# Patient Record
Sex: Female | Born: 1965 | Race: Black or African American | Hispanic: No | Marital: Single
Health system: Southern US, Community
[De-identification: ages and names within clinical notes are randomized; demographics above are authoritative.]

---

## 1997-09-26 ENCOUNTER — Other Ambulatory Visit: Admission: RE | Admit: 1997-09-26 | Discharge: 1997-09-26 | Payer: Self-pay | Admitting: Obstetrics and Gynecology

## 1997-12-22 ENCOUNTER — Other Ambulatory Visit: Admission: RE | Admit: 1997-12-22 | Discharge: 1997-12-22 | Payer: Self-pay | Admitting: Obstetrics and Gynecology

## 1998-05-12 ENCOUNTER — Other Ambulatory Visit: Admission: RE | Admit: 1998-05-12 | Discharge: 1998-05-12 | Payer: Self-pay | Admitting: Obstetrics and Gynecology

## 1998-08-11 ENCOUNTER — Other Ambulatory Visit: Admission: RE | Admit: 1998-08-11 | Discharge: 1998-08-11 | Payer: Self-pay | Admitting: Obstetrics and Gynecology

## 1998-10-12 ENCOUNTER — Ambulatory Visit (HOSPITAL_COMMUNITY): Admission: RE | Admit: 1998-10-12 | Discharge: 1998-10-12 | Payer: Self-pay | Admitting: Internal Medicine

## 1999-08-01 ENCOUNTER — Other Ambulatory Visit: Admission: RE | Admit: 1999-08-01 | Discharge: 1999-08-01 | Payer: Self-pay | Admitting: Obstetrics and Gynecology

## 1999-10-03 ENCOUNTER — Encounter (INDEPENDENT_AMBULATORY_CARE_PROVIDER_SITE_OTHER): Payer: Self-pay | Admitting: Specialist

## 1999-10-03 ENCOUNTER — Ambulatory Visit (HOSPITAL_COMMUNITY): Admission: RE | Admit: 1999-10-03 | Discharge: 1999-10-03 | Payer: Self-pay | Admitting: Obstetrics and Gynecology

## 2002-03-26 ENCOUNTER — Encounter: Payer: Self-pay | Admitting: Internal Medicine

## 2002-03-26 ENCOUNTER — Encounter: Admission: RE | Admit: 2002-03-26 | Discharge: 2002-03-26 | Payer: Self-pay | Admitting: Internal Medicine

## 2003-04-27 ENCOUNTER — Other Ambulatory Visit: Admission: RE | Admit: 2003-04-27 | Discharge: 2003-04-27 | Payer: Self-pay | Admitting: Obstetrics and Gynecology

## 2004-03-07 ENCOUNTER — Other Ambulatory Visit: Admission: RE | Admit: 2004-03-07 | Discharge: 2004-03-07 | Payer: Self-pay | Admitting: Internal Medicine

## 2004-03-16 ENCOUNTER — Encounter: Admission: RE | Admit: 2004-03-16 | Discharge: 2004-03-16 | Payer: Self-pay | Admitting: Internal Medicine

## 2005-03-08 ENCOUNTER — Other Ambulatory Visit: Admission: RE | Admit: 2005-03-08 | Discharge: 2005-03-08 | Payer: Self-pay | Admitting: Internal Medicine

## 2006-03-24 ENCOUNTER — Encounter: Admission: RE | Admit: 2006-03-24 | Discharge: 2006-03-24 | Payer: Self-pay | Admitting: Internal Medicine

## 2007-04-14 ENCOUNTER — Other Ambulatory Visit: Admission: RE | Admit: 2007-04-14 | Discharge: 2007-04-14 | Payer: Self-pay | Admitting: Internal Medicine

## 2007-08-12 ENCOUNTER — Inpatient Hospital Stay (HOSPITAL_COMMUNITY): Admission: AD | Admit: 2007-08-12 | Discharge: 2007-08-13 | Payer: Self-pay | Admitting: Obstetrics & Gynecology

## 2007-12-02 ENCOUNTER — Encounter: Admission: RE | Admit: 2007-12-02 | Discharge: 2007-12-02 | Payer: Self-pay | Admitting: Internal Medicine

## 2009-04-28 ENCOUNTER — Other Ambulatory Visit
Admission: RE | Admit: 2009-04-28 | Discharge: 2009-04-28 | Payer: Self-pay | Source: Home / Self Care | Admitting: Internal Medicine

## 2009-12-08 ENCOUNTER — Ambulatory Visit (HOSPITAL_COMMUNITY)
Admission: RE | Admit: 2009-12-08 | Discharge: 2009-12-08 | Payer: Self-pay | Source: Home / Self Care | Admitting: Obstetrics & Gynecology

## 2010-01-16 ENCOUNTER — Ambulatory Visit (HOSPITAL_COMMUNITY): Admission: RE | Admit: 2010-01-16 | Payer: Self-pay | Source: Home / Self Care | Admitting: Obstetrics & Gynecology

## 2010-02-14 ENCOUNTER — Encounter: Payer: Self-pay | Admitting: Obstetrics & Gynecology

## 2010-06-01 NOTE — Op Note (Signed)
[facility]  Patient:    Osborne, Sarah                       MRN: [id number] Proc. Date: 10/03/99 Adm. Date:  [id number] Attending:  Maxie Better                           Operative Report  PREOPERATIVE DIAGNOSIS:       Exocervical polyp.  PROCEDURE:                    Exocervical polypectomy.  POSTOPERATIVE DIAGNOSIS:      Exocervical polyp.  ANESTHESIA:                   MAC, paracervical block.  SURGEON:                      Sheronette A. Cousins, M.D.  DESCRIPTION OF PROCEDURE:     Under adequate monitored anesthesia, the patient was placed in the dorsal lithotomy position.  She was sterilely prepped and draped in the usual fashion.  The bladder was catheterized to a large amount of urine.  A bivalve speculum was placed in the vagina.  No vaginal lesions were noted.  The cervix was noted to have a protuberant mass arising from the posterior lip of the cervix, and extending into the endocervical canal.  Then 20 cc total of 1% Nesacaine was injected paracervically and on the posterior lip of the cervix.  The mass was grasped with a small ring clamp.  The base was excised using a #15 scalpel, and the polyp was removed by using Metzenbaum scissors to remove the base.  Inspection of the endocervical canal showed still portions of the stalk present, which were then easily again removed using Metzenbaum scissors.  The posterior lip was slightly bleeding.  The base was cauterized.  Monsel solution was applied.  The procedure was then terminated by removing all instruments after documented hemostasis was noted.  SPECIMENS:                    Exocervical polyp.  ESTIMATED BLOOD LOSS:         Minimal.  COMPLICATIONS:                None.  The patient tolerated the procedure well and was transferred to the recovery room in stable condition. DD:  10/03/99 TD:  10/04/99 Job: [id number]

## 2010-10-12 LAB — CBC
HCT: 37.3
Hemoglobin: 12.3
MCHC: 33
MCV: 89.1
RBC: 4.19
RDW: 14.2

## 2010-10-12 LAB — WET PREP, GENITAL: Yeast Wet Prep HPF POC: NONE SEEN

## 2010-10-12 LAB — POCT PREGNANCY, URINE: Preg Test, Ur: POSITIVE

## 2010-10-12 LAB — URINALYSIS, ROUTINE W REFLEX MICROSCOPIC
Glucose, UA: NEGATIVE
Leukocytes, UA: NEGATIVE
Nitrite: NEGATIVE
Specific Gravity, Urine: 1.03
pH: 5

## 2010-10-12 LAB — GC/CHLAMYDIA PROBE AMP, GENITAL
Chlamydia, DNA Probe: NEGATIVE
GC Probe Amp, Genital: NEGATIVE

## 2012-11-10 IMAGING — US US OB COMP LESS 14 WK
1 series · 13 of 28 positions shown · non-contrast
Comparison: none

[Series 1: us ob comp less 14 wks · 0.17mm/px · 13 of 29 slices shown]
[im 2/29]
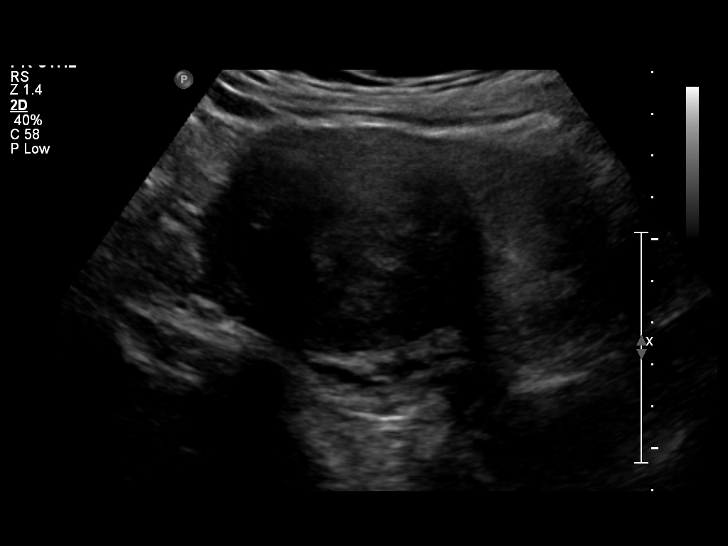
[im 4/29]
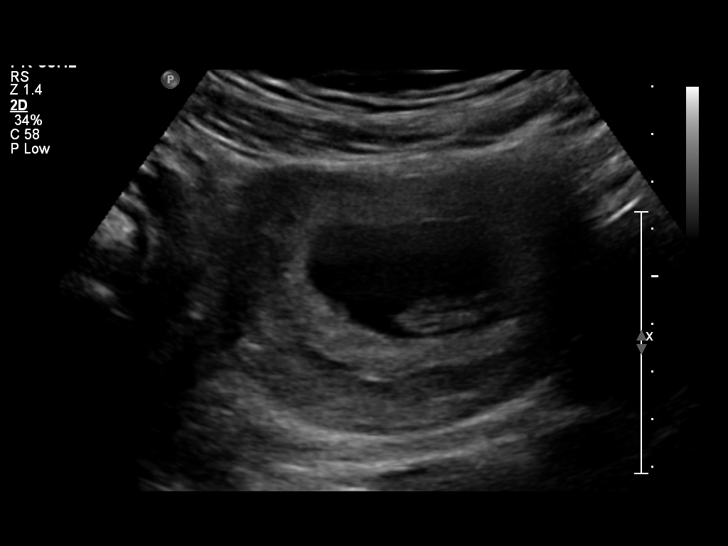
[im 6/29]
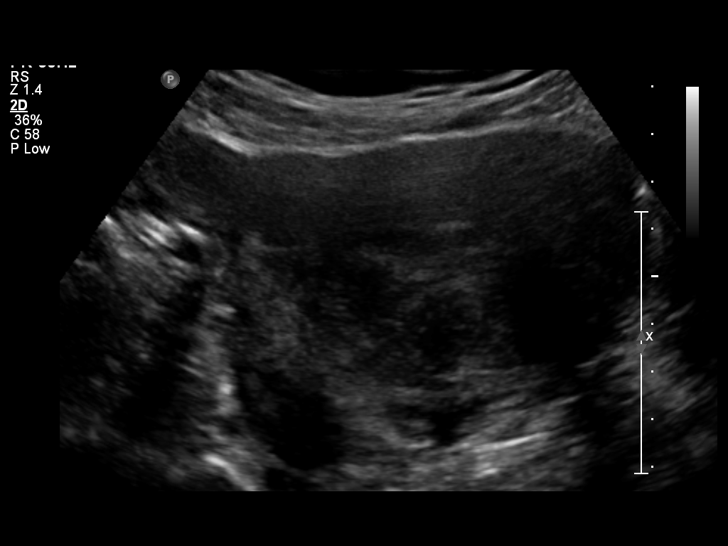
[im 8/29]
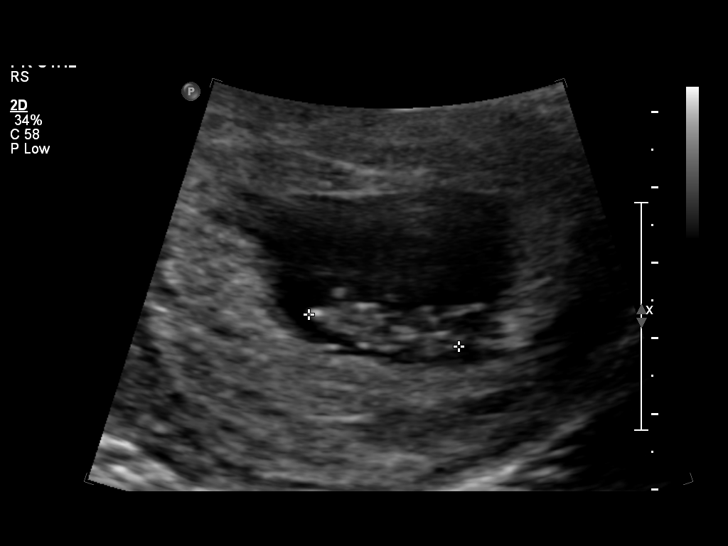
[im 10/29]
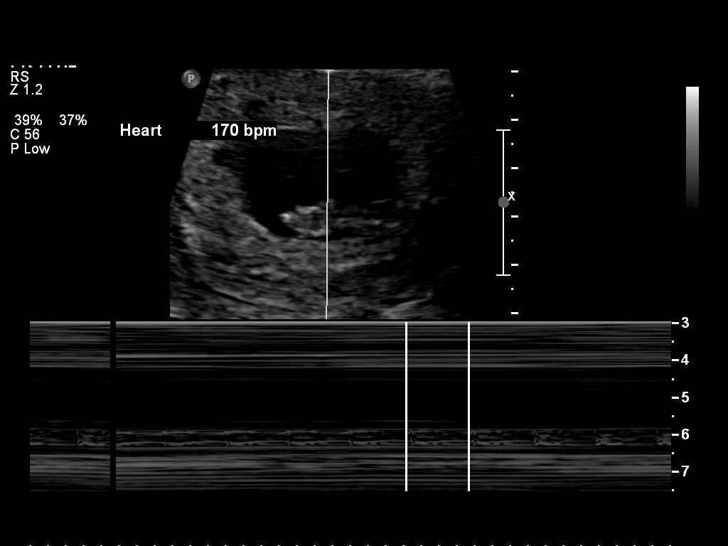
[im 12/29]
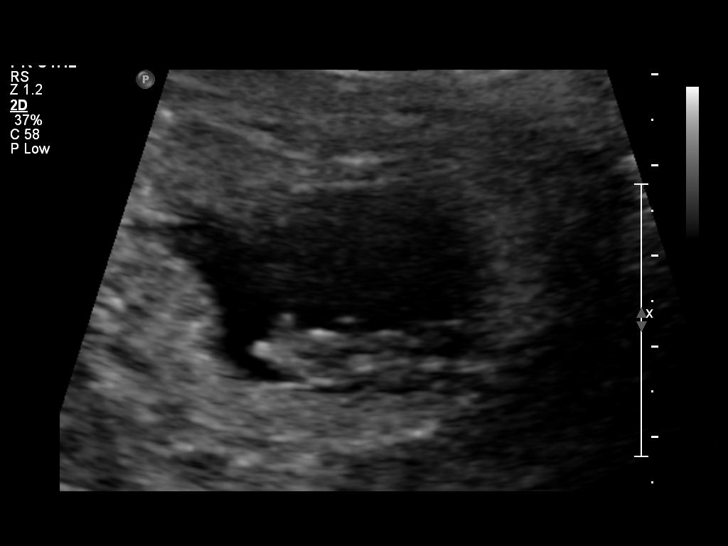
[im 15/29]
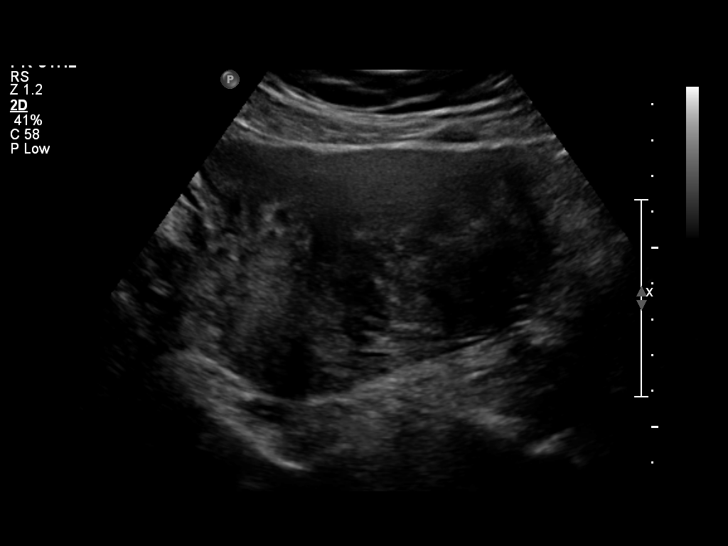
[im 17/29]
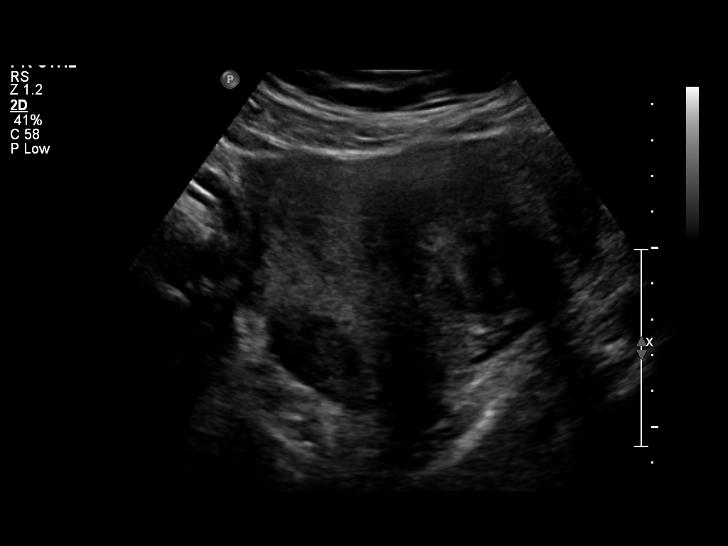
[im 19/29]
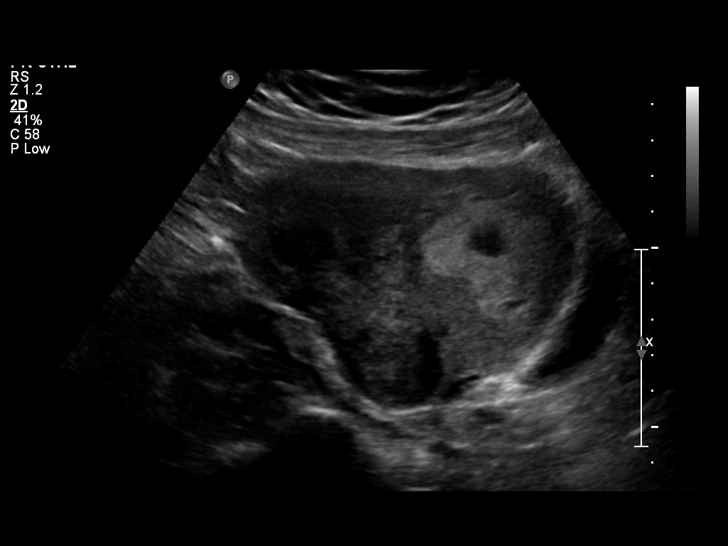
[im 21/29]
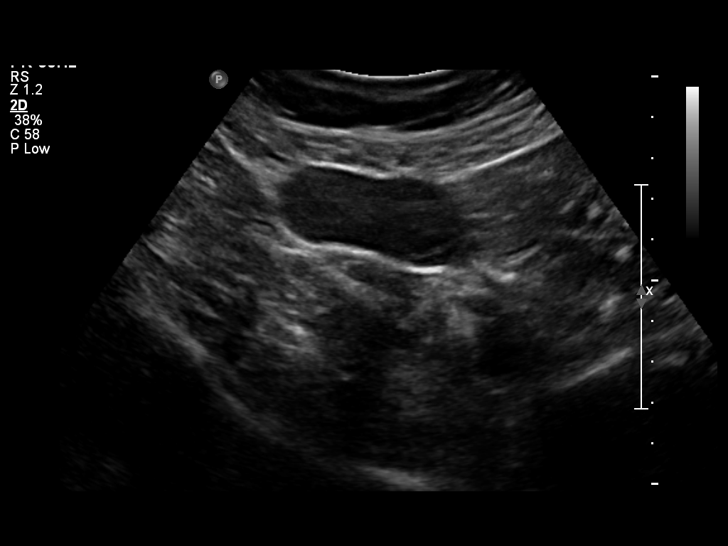
[im 23/29]
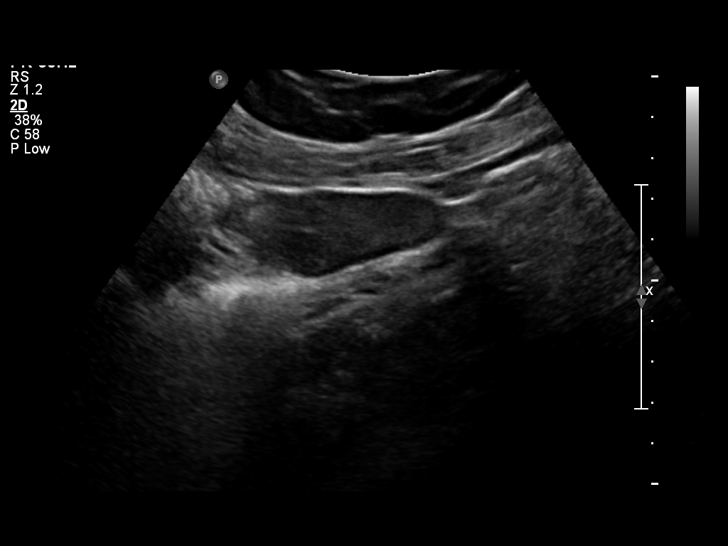
[im 25/29]
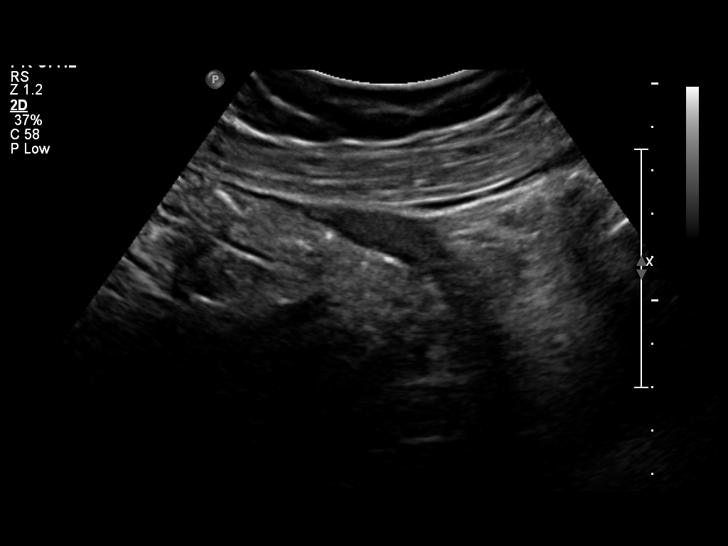
[im 27/29]
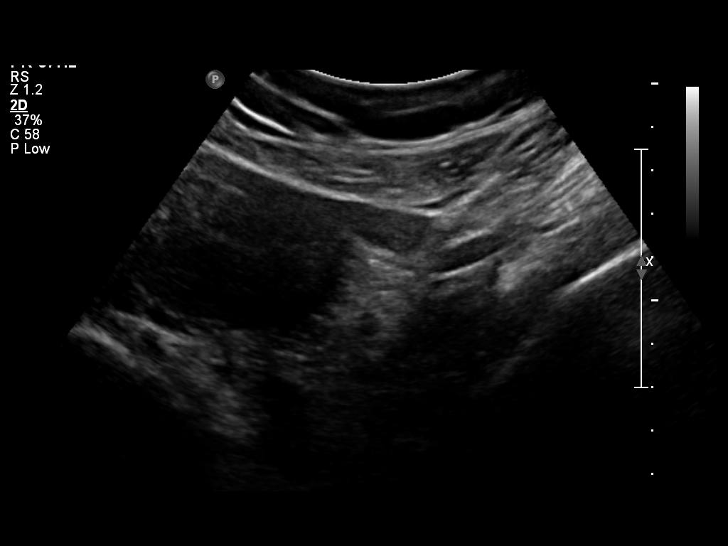

[13 of 28 positions shown; findings below may reference images not displayed]

OBSTETRICS REPORT
                      (Signed Final 12/08/2009 [DATE])

 Order#:         _O
Procedures

 US OB COMP LESS 14 WKS                                76801.0
Indications

 Viability
 IVF
Fetal Evaluation

 Fetal Heart Rate:  170                          bpm
 Cardiac Activity:  Observed

 Amniotic Fluid
 AFI FV:      Subjectively within normal limits
Biometry

 CRL:     20.9  mm     G. Age:  8w 4d                  EDD:    07/16/10
Gestational Age

 Best:          8w 4d      Det. By:  D.O. Conception          EDD:   07/16/10
                                     (10/23/09)
Cervix Uterus Adnexa

 Cervix:       Normal appearance by transabdominal scan.
 Left Ovary:    Within normal limits measuirng 2.5 x 1.0 x 1.8 cm.
 Right Ovary:   Within normal limits measuring 4.6 x 1.9 x 4.0 cm.

 Adnexa:     No abnormality visualized.
Myomas

 Site                     L(cm)      W(cm)       D(cm)      Location
 Fundus Left              6.8        6.6         4.8        Intramural
 Posterior Mid                       2.6         2.2        Intramural
 Blood Flow                  RI       PI       Comments
Impression

 There is a single living intrauterine pregancy demonstrating
 an EGA by CRL of  8w 4d. This correlates well with expected
 EGA by date of conception of 8w 4d.

 Normal ovaries.

 Focal fibroids with sizes and locations as noted above.

## 2012-11-30 ENCOUNTER — Ambulatory Visit (INDEPENDENT_AMBULATORY_CARE_PROVIDER_SITE_OTHER): Payer: Medicaid Other | Admitting: Obstetrics & Gynecology

## 2012-11-30 ENCOUNTER — Encounter: Payer: Self-pay | Admitting: Obstetrics & Gynecology

## 2012-11-30 VITALS — BP 128/82 | Temp 98.1°F | Wt 221.0 lb

## 2012-11-30 VITALS — Ht 63.0 in | Wt 221.0 lb

## 2012-11-30 DIAGNOSIS — Z3481 Encounter for supervision of other normal pregnancy, first trimester: Secondary | ICD-10-CM

## 2012-11-30 DIAGNOSIS — O09521 Supervision of elderly multigravida, first trimester: Secondary | ICD-10-CM

## 2012-11-30 DIAGNOSIS — O09519 Supervision of elderly primigravida, unspecified trimester: Secondary | ICD-10-CM

## 2012-11-30 DIAGNOSIS — Z3201 Encounter for pregnancy test, result positive: Secondary | ICD-10-CM

## 2012-11-30 LAB — POCT URINALYSIS DIPSTICK
Bilirubin, UA: NEGATIVE
Blood, UA: NEGATIVE
Ketones, UA: NEGATIVE
Spec Grav, UA: <=1.005
pH, UA: 8

## 2012-11-30 MED ORDER — PROGESTERONE MICRONIZED 200 MG PO CAPS: 200.0000 mg | ORAL_CAPSULE | Freq: Every day | ORAL | Status: AC

## 2012-11-30 NOTE — Progress Notes (Signed)
Subjective:    Sarah Osborne is being seen today for her first obstetrical visit.  This is not a planned pregnancy. She is at [redacted]w[redacted]d gestation. Her obstetrical history is significant for advanced maternal age. Relationship with FOB: significant other, not living together. Patient does intend to breast feed. Pregnancy history fully reviewed.  Menstrual History: OB History   Grav Para Term Preterm Abortions TAB SAB Ect Mult Living   1 1 1       1        Patient's last menstrual period was 10/23/2012.    The following portions of the patient's history were reviewed and updated as appropriate: allergies, current medications, past family history, past medical history, past social history, past surgical history and problem list.  Review of Systems Pertinent items are noted in HPI.    Objective:      General Appearance:    Alert, cooperative, no distress, appears stated age  Head:    Normocephalic, without obvious abnormality, atraumatic  Eyes:    PERRL, conjunctiva/corneas clear, EOM's intact, fundi    benign, both eyes  Ears:    Normal TM's and external ear canals, both ears  Nose:   Nares normal, septum midline, mucosa normal, no drainage    or sinus tenderness  Throat:   Lips, mucosa, and tongue normal; teeth and gums normal  Neck:   Supple, symmetrical, trachea midline, no adenopathy;    thyroid:  no enlargement/tenderness/nodules; no carotid   bruit or JVD  Back:     Symmetric, no curvature, ROM normal, no CVA tenderness  Lungs:     Clear to auscultation bilaterally, respirations unlabored  Chest Wall:    No tenderness or deformity   Heart:    Regular rate and rhythm, S1 and S2 normal, no murmur, rub   or gallop  Breast Exam:    No tenderness, masses, or nipple abnormality  Abdomen:     Soft, non-tender, bowel sounds active all four quadrants,    no masses, no organomegaly  Genitalia:    Normal female without lesion, discharge or tenderness  Extremities:   Extremities normal,  atraumatic, no cyanosis or edema  Pulses:   2+ and symmetric all extremities  Skin:   Skin color, texture, turgor normal, no rashes or lesions  Lymph nodes:   Cervical, supraclavicular, and axillary nodes normal  Neurologic:   CNII-XII intact, normal strength, sensation and reflexes    throughout   Informal U/S by me: [redacted]w[redacted]d IUGS Assessment:    Likely pregnancy at [redacted]w[redacted]d weeks--two outside UPT positive; UPT today negative     Plan:    Orders Placed This Encounter  Procedures  . hCG, quantitative, pregnancy  . Progesterone  . POCT urinalysis dipstick  . POCT urine pregnancy   Prenatal vitamins.  Counseling provided regarding continued use of seat belts, cessation of alcohol consumption, smoking or use of illicit drugs; infection precautions i.e., influenza/TDAP immunizations, toxoplasmosis,CMV, parvovirus, listeria and varicella; workplace safety, exercise during pregnancy; routine dental care, safe medications, sexual activity, hot tubs, saunas, pools, travel, caffeine use, fish and methlymercury, potential toxins, hair treatments, varicose veins Weight gain recommendations reviewed:  overweight/BMI 25 - 29.9--> gain 15 - 25 lbs Problem list reviewed and updated. Follow up in 1 weeks. 50% of 20 min visit spent on counseling and coordination of care.

## 2012-12-01 DIAGNOSIS — O09529 Supervision of elderly multigravida, unspecified trimester: Secondary | ICD-10-CM | POA: Insufficient documentation

## 2012-12-01 LAB — PROGESTERONE: Progesterone: 5.2 ng/mL

## 2012-12-01 NOTE — Patient Instructions (Signed)
Prenatal Care  WHAT IS PRENATAL CARE?  Prenatal care means health care during your pregnancy, before your baby is born. Take care of yourself and your baby by:   Getting early prenatal care. If you know you are pregnant, or think you might be pregnant, call your caregiver as soon as possible. Schedule a visit for a general/prenatal examination.  Getting regular prenatal care. Follow your caregiver's schedule for blood and other necessary tests. Do not miss appointments.  Do everything you can to keep yourself and your baby healthy during your pregnancy.  Prenatal care should include evaluation of medical, dietary, educational, psychological, and social needs for the couple and the medical, surgical, and genetic history of the family of the mother and father.  Discuss with your caregiver:  Your medicines, prescription, over-the-counter, and herbal medicines.  Substance abuse, alcohol, smoking, and illegal drugs.  Domestic abuse and violence, if present.  Your immunizations.  Nutrition and diet.  Exercising.  Environment and occupational hazards, at home and at work.  History of sexually transmitted disease, both you and your partner.  Previous pregnancies. WHY IS PRENATAL CARE SO IMPORTANT?  By seeing you regularly, your caregiver has the chance to find problems early, so that they can be treated as soon as possible. Other problems might be prevented. Many studies have shown that early and regular prenatal care is important for the health of both mothers and their babies.  I AM THINKING ABOUT GETTING PREGNANT. HOW CAN I TAKE CARE OF MYSELF?  Taking care of yourself before you get pregnant helps you to have a healthy pregnancy. It also lowers your chances of having a baby born with a birth defect. Here are ways to take care of yourself before you get pregnant:   Eat healthy foods, exercise regularly (30 minutes per day for most days of the week is best), and get enough rest and  sleep. Talk to your caregiver about what kinds of foods and exercises are best for you.  Take 400 micrograms (mcg) of folic acid (one of the B vitamins) every day. The best way to do this is to take a daily multivitamin pill that contains this amount of folic acid. Getting enough of the synthetic (manufactured) form of folic acid every day before you get pregnant and during early pregnancy can help prevent certain birth defects. Many breakfast cereals and other grain products have folic acid added to them, but only certain cereals contain 400 mcg of folic acid per serving. Check the label on your multivitamin or cereal to find the amount of folic acid in the food.  See your caregiver for a complete check up before getting pregnant. Make sure that you have had all your immunization shots, especially for rubella (German measles). Rubella can cause serious birth defects. Chickenpox is another illness you want to avoid during pregnancy. If you have had chickenpox and rubella in the past, you should be immune to them.  Tell your caregiver about any prescription or non-prescription medicines (including herbal remedies) you are taking. Some medicines are not safe to take during pregnancy.  Stop smoking cigarettes, drinking alcohol, or taking illegal drugs. Ask your caregiver for help, if you need it. You can also get help with alcohol and drugs by talking with a member of your faith community, a counselor, or a trusted friend.  Discuss and treat any medical, social, or psychological problems before getting pregnant.  Discuss any history of genetic problems in the mother, father, and their families. Do   genetic testing before getting pregnant, when possible.  Discuss any physical or emotional abuse with your caregiver.  Discuss with your caregiver if you might be exposed to harmful chemicals on your job or where you live.  Discuss with your caregiver if you think your job or the hours you work may be  harmful and should be changed.  The father should be involved with the decision making and with all aspects of the pregnancy, labor, and delivery.  If you have medical insurance, make sure you are covered for pregnancy. I JUST FOUND OUT THAT I AM PREGNANT. HOW CAN I TAKE CARE OF MYSELF?  Here are ways to take care of yourself and the precious new life growing inside you:   Continue taking your multivitamin with 400 micrograms (mcg) of folic acid every day.  Get early and regular prenatal care. It does not matter if this is your first pregnancy or if you already have children. It is very important to see a caregiver during your pregnancy. Your caregiver will check at each visit to make sure that you and the baby are healthy. If there are any problems, action can be taken right away to help you and the baby.  Eat a healthy diet that includes:  Fruits.  Vegetables.  Foods low in saturated fat.  Grains.  Calcium-rich foods.  Drink 6 to 8 glasses of liquids a day.  Unless your caregiver tells you not to, try to be physically active for 30 minutes, most days of the week. If you are pressed for time, you can get your activity in through 10 minute segments, three times a day.  If you smoke, drink alcohol, or use drugs, STOP. These can cause long-term damage to your baby. Talk with your caregiver about steps to take to stop smoking. Talk with a member of your faith community, a counselor, a trusted friend, or your caregiver if you are concerned about your alcohol or drug use.  Ask your caregiver before taking any medicine, even over-the-counter medicines. Some medicines are not safe to take during pregnancy.  Get plenty of rest and sleep.  Avoid hot tubs and saunas during pregnancy.  Do not have X-rays taken, unless absolutely necessary and with the recommendation of your caregiver. A lead shield can be placed on your abdomen, to protect the baby when X-rays are taken in other parts of the  body.  Do not empty the cat litter when you are pregnant. It may contain a parasite that causes an infection called toxoplasmosis, which can cause birth defects. Also, use gloves when working in garden areas used by cats.  Do not eat uncooked or undercooked cheese, meats, or fish.  Stay away from toxic chemicals like:  Insecticides.  Solvents (some cleaners or paint thinners).  Lead.  Mercury.  Sexual relations may continue until the end of the pregnancy, unless you have a medical problem or there is a problem with the pregnancy and your caregiver tells you not to.  Do not wear high heel shoes, especially during the second half of the pregnancy. You can lose your balance and fall.  Do not take long trips, unless absolutely necessary. Be sure to see your caregiver before going on the trip.  Do not sit in one position for more than 2 hours, when on a trip.  Take a copy of your medical records when going on a trip.  Know where there is a hospital in the city you are visiting, in case of an   emergency.  Most dangerous household products will have pregnancy warnings on their labels. Ask your caregiver about products if you are unsure.  Limit or eliminate your caffeine intake from coffee, tea, sodas, medicines, and chocolate.  Many women continue working through pregnancy. Staying active might help you stay healthier. If you have a question about the safety or the hours you work at your particular job, talk with your caregiver.  Get informed:  Read books.  Watch videos.  Go to childbirth classes for you and the father.  Talk with experienced moms.  Ask your caregiver about childbirth education classes for you and your partner. Classes can help you and your partner prepare for the birth of your baby.  Ask about a pediatrician (baby doctor) and methods and pain medicine for labor, delivery, and possible Cesarean delivery (C-section). I AM NOT THINKING ABOUT GETTING PREGNANT  RIGHT NOW, BUT HEARD THAT ALL WOMEN SHOULD TAKE FOLIC ACID EVERY DAY?  All women of childbearing age, with even a remote chance of getting pregnant, should try to make sure they get enough folic acid. Many pregnancies are not planned. Many women do not know they are actually pregnant early in their pregnancies, and certain birth defects happen in the very early part of pregnancy. Taking 400 micrograms (mcg) of folic acid every day will help prevent certain birth defects that happen in the early part of pregnancy. If a woman begins taking vitamin pills in the second or third month of pregnancy, it may be too late to prevent birth defects. Folic acid may also have other health benefits for women, besides preventing birth defects.  HOW OFTEN SHOULD I SEE MY CAREGIVER DURING PREGNANCY?  Your caregiver will give you a schedule for your prenatal visits. You will have visits more often as you get closer to the end of your pregnancy. An average pregnancy lasts about 40 weeks.  A typical schedule includes visiting your caregiver:   About once each month, during your first 6 months of pregnancy.  Every 2 weeks, during the next 2 months.  Weekly in the last month, until the delivery date. Your caregiver will probably want to see you more often if:  You are over 35.  Your pregnancy is high risk, because you have certain health problems or problems with the pregnancy, such as:  Diabetes.  High blood pressure.  The baby is not growing on schedule, according to the dates of the pregnancy. Your caregiver will do special tests, to make sure you and the baby are not having any serious problems. WHAT HAPPENS DURING PRENATAL VISITS?   At your first prenatal visit, your caregiver will talk to you about you and your partner's health history and your family's health history, and will do a physical exam.  On your first visit, a physical exam will include checks of your blood pressure, height and weight, and an  exam of your pelvic organs. Your caregiver will do a Pap test if you have not had one recently, and will do cultures of your cervix to make sure there is no infection.  At each visit, there will be tests of your blood, urine, blood pressure, weight, and checking the progress of the baby.  Your caregiver will be able to tell you when to expect that your baby will be born.  Each visit is also a chance for you to learn about staying healthy during pregnancy and for asking questions.  Discuss whether you will be breastfeeding.  At your later prenatal   visits, your caregiver will check how you are doing and how the baby is developing. You may have a number of tests done as your pregnancy progresses.  Ultrasound exams are often used to check on the baby's growth and health.  You may have more urine and blood tests, as well as special tests, if needed. These may include amniocentesis (examine fluid in the pregnancy sac), stress tests (check how baby responds to contractions), biophysical profile (measures fetus well-being). Your caregiver will explain the tests and why they are necessary. I AM IN MY LATE THIRTIES, AND I WANT TO HAVE A CHILD NOW. SHOULD I DO ANYTHING SPECIAL?  As you get older, there is more chance of having a medical problem (high blood pressure), pregnancy problem (preeclampsia, problems with the placenta), miscarriage, or a baby born with a birth defect. However, most women in their late thirties and early forties have healthy babies. See your caregiver on a regular basis before you get pregnant and be sure to go for exams throughout your pregnancy. Your caregiver probably will want to do some special tests to check on you and your baby's health when you are pregnant.  Women today are often delaying having children until later in life, when they are in their thirties and forties. While many women in their thirties and forties have no difficulty getting pregnant, fertility does decline  with age. For women over 40 who cannot get pregnant after 6 months of trying, it is recommended that they see their caregiver for a fertility evaluation. It is not uncommon to have trouble becoming pregnant or experience infertility (inability to become pregnant after trying for one year). If you think that you or your partner may be infertile, you can discuss this with your caregiver. He or she can recommend treatments such as drugs, surgery, or assisted reproductive technology.  Document Released: 01/03/2003 Document Revised: 03/25/2011 Document Reviewed: 06/18/2012 [employer] Patient Information 2014 [employer].  

## 2012-12-02 ENCOUNTER — Other Ambulatory Visit: Payer: Self-pay | Admitting: *Deleted

## 2012-12-02 DIAGNOSIS — O0990 Supervision of high risk pregnancy, unspecified, unspecified trimester: Secondary | ICD-10-CM

## 2012-12-07 ENCOUNTER — Ambulatory Visit (INDEPENDENT_AMBULATORY_CARE_PROVIDER_SITE_OTHER): Payer: Medicaid Other | Admitting: Obstetrics & Gynecology

## 2012-12-07 ENCOUNTER — Ambulatory Visit (HOSPITAL_COMMUNITY): Payer: Medicaid Other

## 2012-12-07 ENCOUNTER — Encounter: Payer: Self-pay | Admitting: Obstetrics & Gynecology

## 2012-12-07 VITALS — BP 137/83 | Temp 98.7°F | Wt 219.0 lb

## 2012-12-07 DIAGNOSIS — Z3481 Encounter for supervision of other normal pregnancy, first trimester: Secondary | ICD-10-CM

## 2012-12-07 DIAGNOSIS — D259 Leiomyoma of uterus, unspecified: Secondary | ICD-10-CM | POA: Insufficient documentation

## 2012-12-07 DIAGNOSIS — Z3480 Encounter for supervision of other normal pregnancy, unspecified trimester: Secondary | ICD-10-CM

## 2012-12-07 DIAGNOSIS — O200 Threatened abortion: Secondary | ICD-10-CM

## 2012-12-07 LAB — POCT URINALYSIS DIPSTICK
Bilirubin, UA: NEGATIVE
Leukocytes, UA: NEGATIVE
Nitrite, UA: NEGATIVE
pH, UA: 5

## 2012-12-07 NOTE — Progress Notes (Signed)
Pulse 101  Pt states that she has had some lower abdominal pressure.  U/S by me--irregular GS 17 mm.  No YS/FP.

## 2012-12-08 LAB — PROGESTERONE: Progesterone: 9.29 ng/mL

## 2012-12-08 LAB — HCG, QUANTITATIVE, PREGNANCY: hCG, Beta Chain, Quant, S: 907.5 m[IU]/mL

## 2012-12-14 ENCOUNTER — Ambulatory Visit (INDEPENDENT_AMBULATORY_CARE_PROVIDER_SITE_OTHER): Payer: Medicaid Other | Admitting: Obstetrics & Gynecology

## 2012-12-14 ENCOUNTER — Encounter: Payer: Medicaid Other | Admitting: Obstetrics & Gynecology

## 2012-12-14 ENCOUNTER — Ambulatory Visit (HOSPITAL_COMMUNITY)
Admission: RE | Admit: 2012-12-14 | Discharge: 2012-12-14 | Disposition: A | Discharge status: Home / Self Care | Payer: Medicaid Other | Source: Ambulatory Visit | Attending: Obstetrics & Gynecology | Admitting: Obstetrics & Gynecology

## 2012-12-14 ENCOUNTER — Other Ambulatory Visit: Payer: Self-pay | Admitting: Obstetrics & Gynecology

## 2012-12-14 VITALS — BP 135/95 | Temp 99.0°F | Wt 220.0 lb

## 2012-12-14 DIAGNOSIS — O0990 Supervision of high risk pregnancy, unspecified, unspecified trimester: Secondary | ICD-10-CM | POA: Insufficient documentation

## 2012-12-14 DIAGNOSIS — Z3481 Encounter for supervision of other normal pregnancy, first trimester: Secondary | ICD-10-CM

## 2012-12-14 DIAGNOSIS — O209 Hemorrhage in early pregnancy, unspecified: Secondary | ICD-10-CM | POA: Insufficient documentation

## 2012-12-14 DIAGNOSIS — O200 Threatened abortion: Secondary | ICD-10-CM

## 2012-12-14 DIAGNOSIS — Z3480 Encounter for supervision of other normal pregnancy, unspecified trimester: Secondary | ICD-10-CM

## 2012-12-14 NOTE — Progress Notes (Signed)
IUGS present--informal U/S.  Early pregnancy failure--likely IUP.  Formal U/S today.  Repeat quantitative bHCG.

## 2012-12-14 NOTE — Progress Notes (Signed)
Pt not evaluated

## 2012-12-14 NOTE — Patient Instructions (Signed)
Miscarriage A miscarriage is the sudden loss of an unborn baby (fetus) before the 20th week of pregnancy. Most miscarriages happen in the first 3 months of pregnancy. Sometimes, it happens before a woman even knows she is pregnant. A miscarriage is also called a "spontaneous miscarriage" or "early pregnancy loss." Having a miscarriage can be an emotional experience. Talk with your caregiver about any questions you may have about miscarrying, the grieving process, and your future pregnancy plans. CAUSES   Problems with the fetal chromosomes that make it impossible for the baby to develop normally. Problems with the baby's genes or chromosomes are most often the result of errors that occur, by chance, as the embryo divides and grows. The problems are not inherited from the parents.  Infection of the cervix or uterus.   Hormone problems.   Problems with the cervix, such as having an incompetent cervix. This is when the tissue in the cervix is not strong enough to hold the pregnancy.   Problems with the uterus, such as an abnormally shaped uterus, uterine fibroids, or congenital abnormalities.   Certain medical conditions.   Smoking, drinking alcohol, or taking illegal drugs.   Trauma.  Often, the cause of a miscarriage is unknown.  SYMPTOMS   Vaginal bleeding or spotting, with or without cramps or pain.  Pain or cramping in the abdomen or lower back.  Passing fluid, tissue, or blood clots from the vagina. DIAGNOSIS  Your caregiver will perform a physical exam. You may also have an ultrasound to confirm the miscarriage. Blood or urine tests may also be ordered. TREATMENT   Sometimes, treatment is not necessary if you naturally pass all the fetal tissue that was in the uterus. If some of the fetus or placenta remains in the body (incomplete miscarriage), tissue left behind may become infected and must be removed. Usually, a dilation and curettage (D and C) procedure is performed.  During a D and C procedure, the cervix is widened (dilated) and any remaining fetal or placental tissue is gently removed from the uterus.  Antibiotic medicines are prescribed if there is an infection. Other medicines may be given to reduce the size of the uterus (contract) if there is a lot of bleeding.  If you have Rh negative blood and your baby was Rh positive, you will need a Rh immunoglobulin shot. This shot will protect any future baby from having Rh blood problems in future pregnancies. HOME CARE INSTRUCTIONS   Your caregiver may order bed rest or may allow you to continue light activity. Resume activity as directed by your caregiver.  Have someone help with home and family responsibilities during this time.   Keep track of the number of sanitary pads you use each day and how soaked (saturated) they are. Write down this information.   Do not use tampons. Do not douche or have sexual intercourse until approved by your caregiver.   Only take over-the-counter or prescription medicines for pain or discomfort as directed by your caregiver.   Do not take aspirin. Aspirin can cause bleeding.   Keep all follow-up appointments with your caregiver.   If you or your partner have problems with grieving, talk to your caregiver or seek counseling to help cope with the pregnancy loss. Allow enough time to grieve before trying to get pregnant again.  SEEK IMMEDIATE MEDICAL CARE IF:   You have severe cramps or pain in your back or abdomen.  You have a fever.  You pass large blood clots (walnut-sized   or larger) ortissue from your vagina. Save any tissue for your caregiver to inspect.   Your bleeding increases.   You have a thick, bad-smelling vaginal discharge.  You become lightheaded, weak, or you faint.   You have chills.  MAKE SURE YOU:  Understand these instructions.  Will watch your condition.  Will get help right away if you are not doing well or get  worse. Document Released: 06/26/2000 Document Revised: 04/27/2012 Document Reviewed: 02/19/2011 [employer] Patient Information 2014 [employer].  

## 2012-12-14 NOTE — Progress Notes (Signed)
HR Pt in office for routine OB visit, reports small amount bright red vaginal discharge, states started yesterday, reports mild abd cramping.

## 2012-12-15 LAB — HCG, QUANTITATIVE, PREGNANCY: hCG, Beta Chain, Quant, S: 544.2 m[IU]/mL

## 2012-12-21 ENCOUNTER — Encounter: Payer: Medicaid Other | Admitting: Obstetrics & Gynecology

## 2012-12-21 ENCOUNTER — Ambulatory Visit (INDEPENDENT_AMBULATORY_CARE_PROVIDER_SITE_OTHER): Payer: Medicaid Other | Admitting: Obstetrics & Gynecology

## 2012-12-21 VITALS — BP 143/77 | Temp 97.8°F | Wt 221.0 lb

## 2012-12-21 DIAGNOSIS — N96 Recurrent pregnancy loss: Secondary | ICD-10-CM | POA: Insufficient documentation

## 2012-12-21 DIAGNOSIS — O039 Complete or unspecified spontaneous abortion without complication: Secondary | ICD-10-CM

## 2012-12-21 NOTE — Progress Notes (Signed)
Subjective:     Sarah Osborne is a 47 y.o. female here for f/u.  She reports she passed the pregnancy several days ago.  The bleeding has subsided.   Obstetric History OB History  Gravida Para Term Preterm AB SAB TAB Ectopic Multiple Living  1 1 1       1     # Outcome Date GA Lbr Len/2nd Weight Sex Delivery Anes PTL Lv  1 TRM 06/22/10 [redacted]w[redacted]d  2.211 kg (4 lb 14 oz) F CS EPI N Y       The following portions of the patient's history were reviewed and updated as appropriate: allergies, current medications, past family history, past medical history, past social history, past surgical history and problem list.  Review of Systems Pertinent items are noted in HPI.    Objective:     SPEC:  The cervix is closed Informal U/S: no IUGS     Assessment:   Complete AB  Plan:    Return in a few months

## 2012-12-21 NOTE — Progress Notes (Signed)
Pulse 89 Pt is in office today to follow up from possible miscarriage. Pt had an u/s on 12/14/12. Pt is here today to discuss results.

## 2012-12-21 NOTE — Patient Instructions (Signed)
Recurrent Miscarriage  Recurrent miscarriage means that a woman has lost two or more pregnancies in a row. The loss happens before 20 weeks of pregnancy. Primary recurrent miscarriage is with a woman that has never been able to give birth to a child. Secondary recurrent miscarriage is a woman who had a child and then had two or more miscarriages in a row.   CAUSES   · Your parents passed it to you (genetic).  · Chromosomal defects.  · Endocrine disorders. A person's endocrine system includes glands that make hormones.  · Having a lack of progesterone hormone in early pregnancy.  · Thyroid problems.  · Insulin resistance seen in diabetic women and women with Polycystic Ovary Syndrome that is hard to control.  · Abnormalities of the uterus.  · Certain viral and germ (bacterial) infections.  · Autoimmune disorders. This is when the immune system attacks or destroys healthy body tissue.  · Smoking, drinking or taking drugs or medicines.  · Being exposed to certain chemicals or toxins at home or work.  HOME CARE INSTRUCTIONS   · See your caregiver if you have two or more miscarriages.  · Follow the advice for testing and treatment that your caregiver recommends.  · Discuss any concerns or questions with your caregiver.  · Do not smoke or drink alcohol when pregnant.  · Recurrent miscarriages can have a severe emotional and psychological effect on a couple wanting to have children. They may have feelings of anger, blame, guilt and become depressed after losing a pregnancy many times. Join a support group or see a grief counselor if you have emotional problems because of pregnancy loss.  SEEK MEDICAL CARE IF:   · You are or think you are pregnant and have any kind of vaginal spotting or bleeding.  · You develop low abdominal cramps.  · You develop a fever of 102° F (38.9° C) or higher.  · You develop abnormal vaginal discharge.  · You have been or think you have been exposed to a spreadable (contagious) illness, toxins or  chemicals that make you sick to your stomach (nauseated) or throw up (vomit).  · You think you have been exposed to a sexually transmitted disease.  Document Released: 06/19/2007 Document Revised: 03/25/2011 Document Reviewed: 06/19/2007  [employer]® Patient Information ©2014 [employer].

## 2013-02-03 ENCOUNTER — Ambulatory Visit (INDEPENDENT_AMBULATORY_CARE_PROVIDER_SITE_OTHER): Payer: Medicaid Other | Admitting: Obstetrics & Gynecology

## 2013-02-03 ENCOUNTER — Other Ambulatory Visit: Payer: Medicaid Other

## 2013-02-03 ENCOUNTER — Other Ambulatory Visit: Payer: Self-pay | Admitting: Obstetrics & Gynecology

## 2013-02-03 DIAGNOSIS — Z124 Encounter for screening for malignant neoplasm of cervix: Secondary | ICD-10-CM

## 2013-02-03 DIAGNOSIS — Z0000 Encounter for general adult medical examination without abnormal findings: Secondary | ICD-10-CM

## 2013-02-03 DIAGNOSIS — IMO0002 Reserved for concepts with insufficient information to code with codable children: Secondary | ICD-10-CM

## 2013-02-03 DIAGNOSIS — Z1231 Encounter for screening mammogram for malignant neoplasm of breast: Secondary | ICD-10-CM

## 2013-02-03 LAB — CBC
HCT: 38.6 % (ref 36.0–46.0)
HEMOGLOBIN: 13 g/dL (ref 12.0–15.0)
MCH: 27.8 pg (ref 26.0–34.0)
MCHC: 33.7 g/dL (ref 30.0–36.0)
MCV: 82.5 fL (ref 78.0–100.0)
PLATELETS: 299 10*3/uL (ref 150–400)
RBC: 4.68 MIL/uL (ref 3.87–5.11)
RDW: 15 % (ref 11.5–15.5)
WBC: 7 10*3/uL (ref 4.0–10.5)

## 2013-02-03 MED ORDER — MEASLES, MUMPS & RUBELLA VAC ~~LOC~~ INJ: 0.5000 mL | INJECTION | Freq: Once | SUBCUTANEOUS | Status: DC

## 2013-02-03 NOTE — Progress Notes (Signed)
Patient is in the office for follow up- patient needs a pap smear to be current with her care.  Pelvic: NEFG, cervix without lesions, scant menstrual blood taken  A/P Routine Pap smear taken today  -->Return prn

## 2013-02-04 LAB — HEPATITIS C ANTIBODY: HCV AB: NEGATIVE

## 2013-02-04 LAB — RUBELLA SCREEN: RUBELLA: 9.15 {index} — AB (ref <0.90)

## 2013-02-04 LAB — RPR

## 2013-02-04 LAB — HIV ANTIBODY (ROUTINE TESTING W REFLEX): HIV: NONREACTIVE

## 2013-02-04 LAB — VARICELLA ZOSTER ANTIBODY, IGG: Varicella IgG: 2275 Index — ABNORMAL HIGH (ref <135.00)

## 2013-02-04 LAB — HEPATITIS B SURFACE ANTIGEN: HEP B S AG: NEGATIVE

## 2013-02-05 LAB — PAP IG, CT-NG NAA, HPV HIGH-RISK
CHLAMYDIA PROBE AMP: NEGATIVE
GC PROBE AMP: NEGATIVE
HPV DNA High Risk: NOT DETECTED

## 2013-02-10 ENCOUNTER — Ambulatory Visit: Payer: Medicaid Other

## 2013-02-11 ENCOUNTER — Encounter: Payer: Self-pay | Admitting: Obstetrics & Gynecology

## 2013-02-12 ENCOUNTER — Ambulatory Visit
Admission: RE | Admit: 2013-02-12 | Discharge: 2013-02-12 | Disposition: A | Discharge status: Home / Self Care | Payer: Medicaid Other | Source: Ambulatory Visit | Attending: Obstetrics & Gynecology | Admitting: Obstetrics & Gynecology

## 2013-02-12 DIAGNOSIS — Z1231 Encounter for screening mammogram for malignant neoplasm of breast: Secondary | ICD-10-CM

## 2013-02-15 ENCOUNTER — Other Ambulatory Visit: Payer: Self-pay | Admitting: *Deleted

## 2013-02-15 ENCOUNTER — Other Ambulatory Visit: Payer: Medicaid Other

## 2013-02-15 ENCOUNTER — Ambulatory Visit (HOSPITAL_COMMUNITY)
Admission: RE | Admit: 2013-02-15 | Discharge: 2013-02-15 | Disposition: A | Discharge status: Home / Self Care | Payer: Medicaid Other | Source: Ambulatory Visit | Attending: Obstetrics & Gynecology | Admitting: Obstetrics & Gynecology

## 2013-02-15 ENCOUNTER — Encounter: Payer: Self-pay | Admitting: Obstetrics & Gynecology

## 2013-02-15 DIAGNOSIS — N979 Female infertility, unspecified: Secondary | ICD-10-CM | POA: Insufficient documentation

## 2013-02-15 DIAGNOSIS — D259 Leiomyoma of uterus, unspecified: Secondary | ICD-10-CM | POA: Insufficient documentation

## 2013-02-15 DIAGNOSIS — N8300 Follicular cyst of ovary, unspecified side: Secondary | ICD-10-CM | POA: Insufficient documentation

## 2013-02-15 DIAGNOSIS — N96 Recurrent pregnancy loss: Secondary | ICD-10-CM

## 2013-02-17 ENCOUNTER — Other Ambulatory Visit: Payer: Medicaid Other

## 2013-02-17 DIAGNOSIS — IMO0002 Reserved for concepts with insufficient information to code with codable children: Secondary | ICD-10-CM

## 2013-02-17 LAB — PROGESTERONE: Progesterone: 0.6 ng/mL

## 2013-02-17 LAB — FSH/LH
FSH: 25.7 m[IU]/mL
LH: 59.1 m[IU]/mL

## 2013-02-17 LAB — ESTRADIOL: Estradiol: 141.5 pg/mL

## 2013-02-18 LAB — LUTEINIZING HORMONE: LH: 13.5 m[IU]/mL

## 2013-02-18 LAB — ESTRADIOL: Estradiol: 80.3 pg/mL

## 2013-02-18 LAB — PROGESTERONE: Progesterone: 1.4 ng/mL

## 2013-02-18 LAB — FOLLICLE STIMULATING HORMONE: FSH: 9.9 m[IU]/mL

## 2013-02-22 ENCOUNTER — Ambulatory Visit: Payer: Medicaid Other

## 2013-03-15 ENCOUNTER — Other Ambulatory Visit: Payer: Self-pay | Admitting: *Deleted

## 2013-03-15 ENCOUNTER — Ambulatory Visit (HOSPITAL_COMMUNITY)
Admission: RE | Admit: 2013-03-15 | Discharge: 2013-03-15 | Disposition: A | Discharge status: Home / Self Care | Payer: Medicaid Other | Source: Ambulatory Visit | Attending: Obstetrics & Gynecology | Admitting: Obstetrics & Gynecology

## 2013-03-15 ENCOUNTER — Other Ambulatory Visit: Payer: Medicaid Other

## 2013-03-15 DIAGNOSIS — IMO0002 Reserved for concepts with insufficient information to code with codable children: Secondary | ICD-10-CM

## 2013-03-15 DIAGNOSIS — D250 Submucous leiomyoma of uterus: Secondary | ICD-10-CM | POA: Insufficient documentation

## 2013-03-15 DIAGNOSIS — N979 Female infertility, unspecified: Secondary | ICD-10-CM | POA: Insufficient documentation

## 2013-03-15 DIAGNOSIS — D251 Intramural leiomyoma of uterus: Secondary | ICD-10-CM | POA: Insufficient documentation

## 2013-03-15 DIAGNOSIS — N96 Recurrent pregnancy loss: Secondary | ICD-10-CM

## 2013-03-16 LAB — PROGESTERONE: PROGESTERONE: 2 ng/mL

## 2013-03-16 LAB — FSH/LH
FSH: 4.3 m[IU]/mL
LH: 6.9 m[IU]/mL

## 2013-03-16 LAB — ESTRADIOL: ESTRADIOL: 282 pg/mL

## 2013-03-18 ENCOUNTER — Other Ambulatory Visit (HOSPITAL_COMMUNITY): Payer: Medicaid Other

## 2013-11-15 ENCOUNTER — Encounter: Payer: Self-pay | Admitting: Obstetrics & Gynecology

## 2014-01-10 ENCOUNTER — Encounter: Payer: Self-pay | Admitting: *Deleted

## 2014-01-11 ENCOUNTER — Encounter: Payer: Self-pay | Admitting: Obstetrics & Gynecology

## 2015-11-17 IMAGING — US US OB TRANSVAGINAL
1 series · 14 of 28 positions shown · non-contrast
Comparison: None.

CLINICAL DATA: Scan for viability. Beta HCG is pending. By LMP, the
patient is 7 weeks 3 days.

EXAM:
OBSTETRIC <14 WK ULTRASOUND
TECHNIQUE: Transabdominal ultrasound was performed for evaluation of the
gestation as well as the maternal uterus and adnexal regions.

[Series 1: us ob comp less 14 wks · 45 acquisitions, 14 frames shown]
[im 2/45]
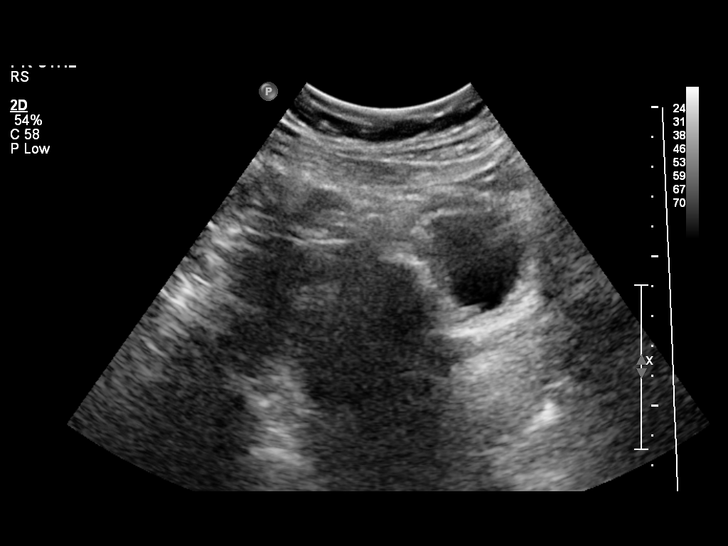
[im 5/45]
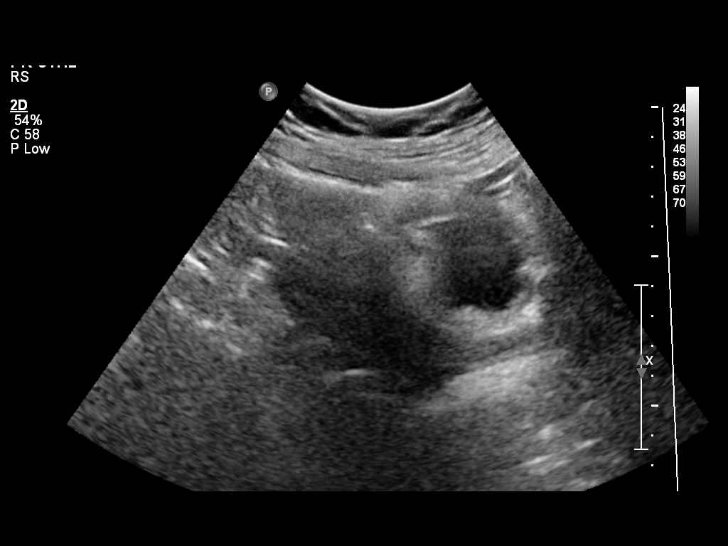
[im 9/45]
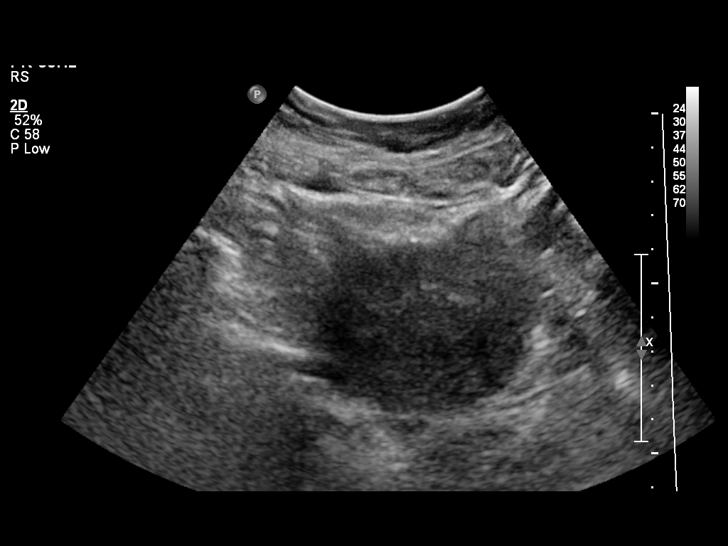
[im 12/45]
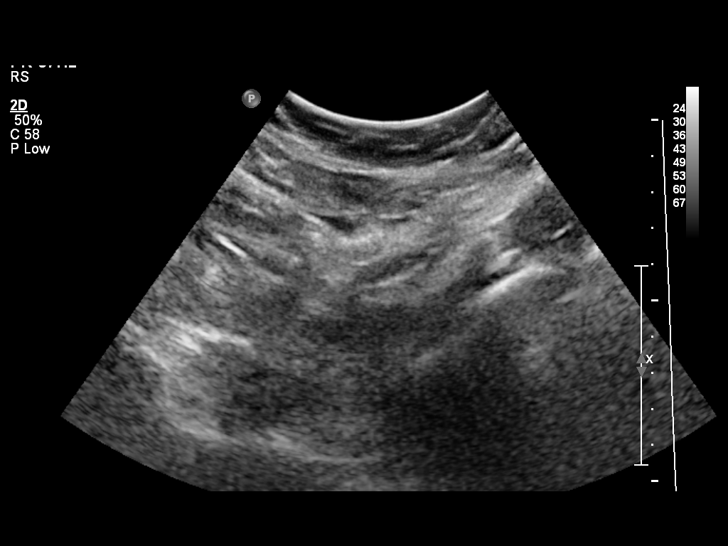
[im 15/45]
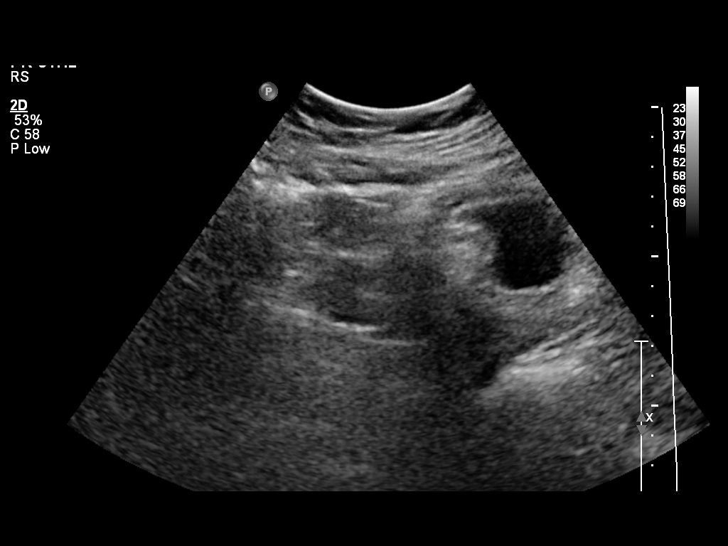
[im 18/45]
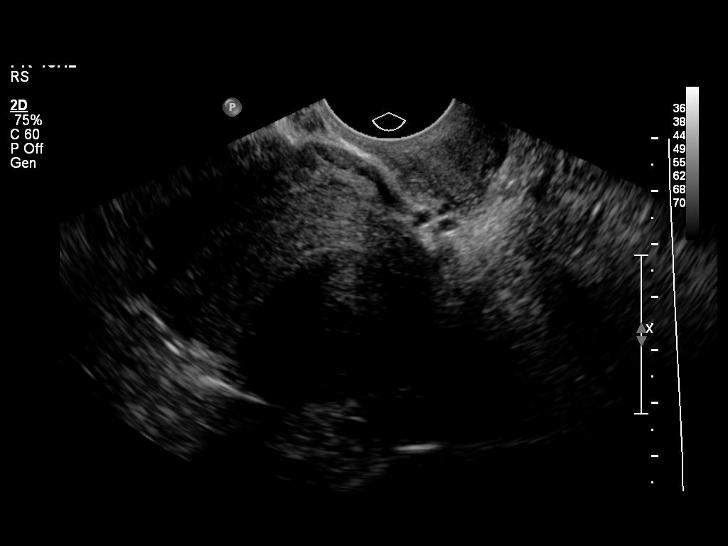
[im 22/45]
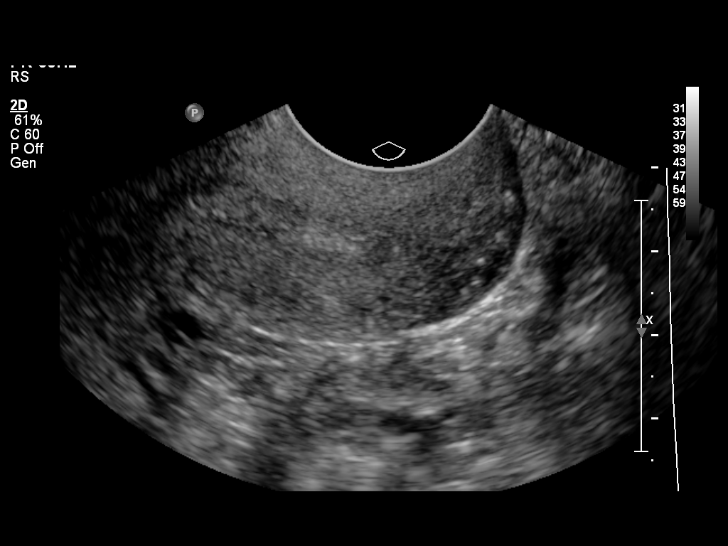
[im 25/45]
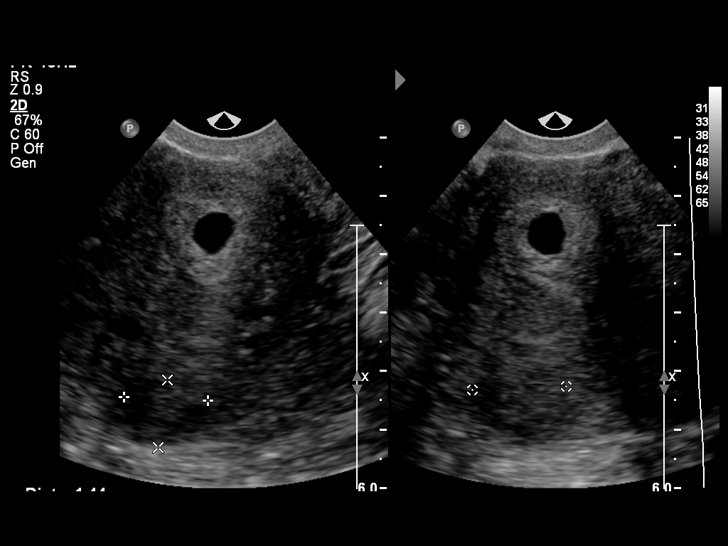
[im 28/45]
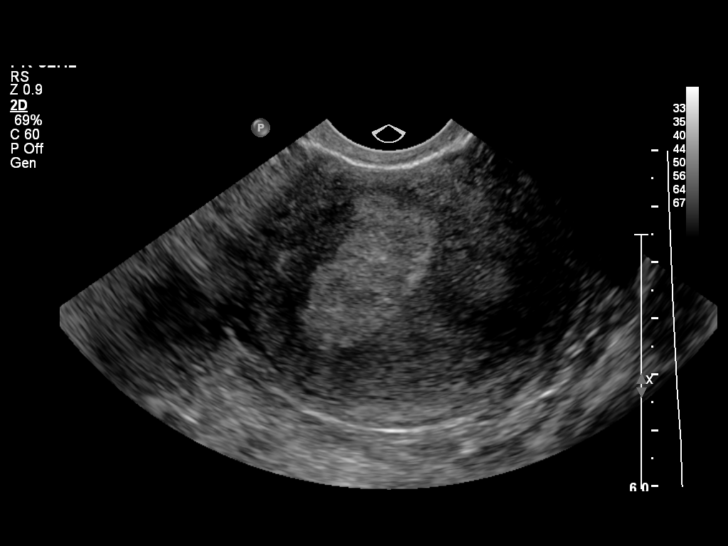
[im 31/45]
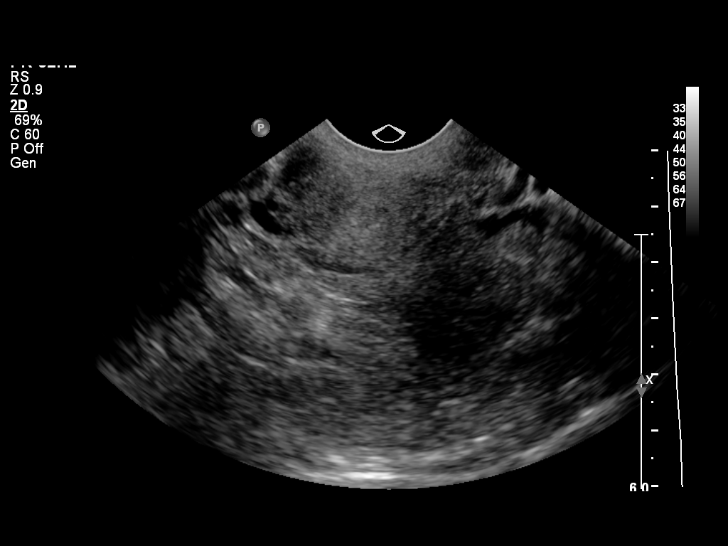
[im 35/45]
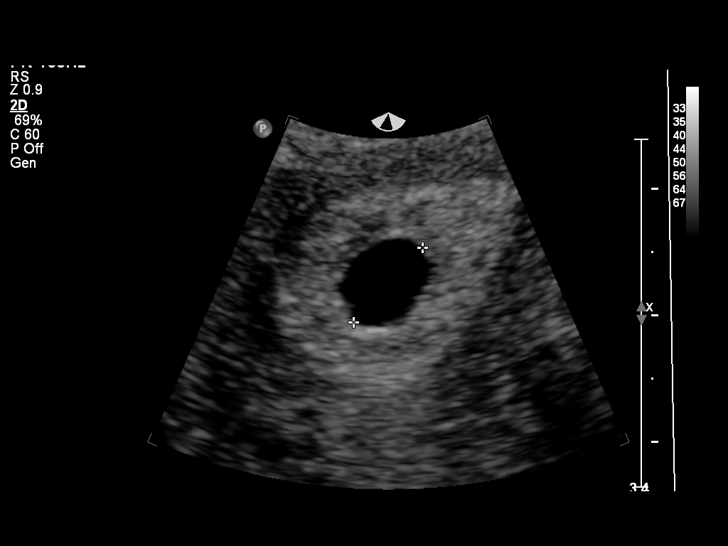
[im 38/45]
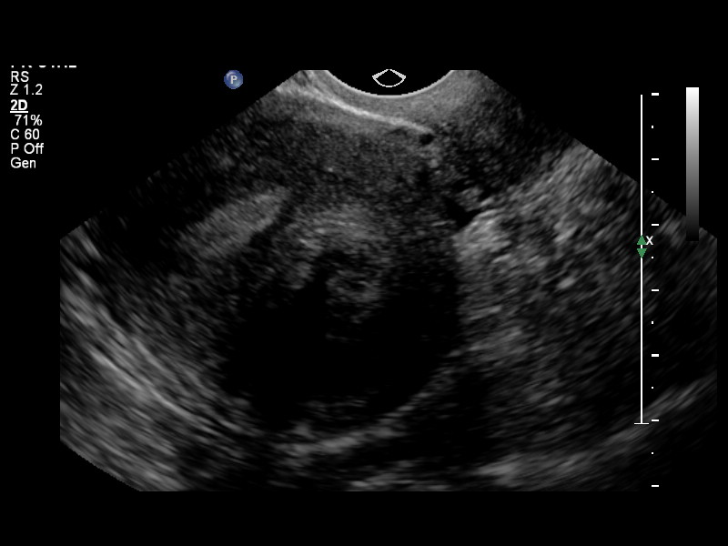
[im 41/45]
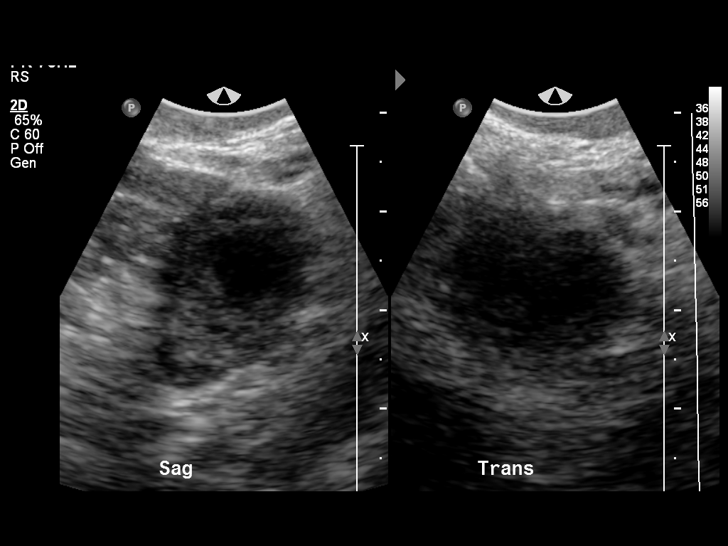
[im 45/45]
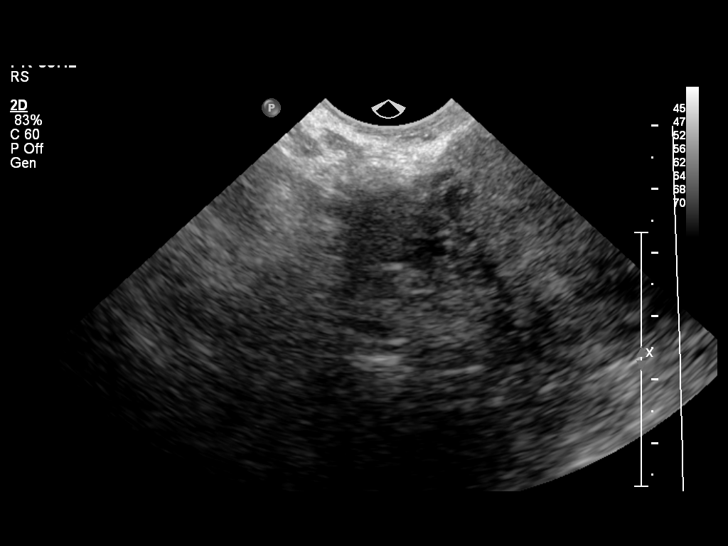

[14 of 28 positions shown; findings below may reference images not displayed]

FINDINGS: Intrauterine gestational sac: Present

Yolk sac:  Not seen

Embryo:  Not seen

Cardiac Activity: Not seen

MSD: 8.7  mm   5 w   4  d

Maternal uterus/adnexae: The right ovary is somewhat difficult to
image. No evidence for adnexal mass. The left ovary has a normal
appearance.
IMPRESSION: 1. Single intrauterine gestational sac.
2. Follow-up ultrasound is recommended in 14 days to document
presence of fetal pole and for dating purposes.

## 2016-01-19 IMAGING — US US FOLLICLE
1 series · 14 of 16 positions shown · non-contrast
Comparison: 12/14/2012

CLINICAL DATA: Infertility. Follicle study prior to ovulation
induction medication.

EXAM:
TRANSVAGINAL ULTRASOUND OF PELVIS (FOLLICLE STUDY)
TECHNIQUE: Transvaginal ultrasound examination of the pelvis was performed
including evaluation of the uterus, ovaries, adnexal regions, and
pelvic cul-de-sac.

[Series 1: us follicle · 14 of 36 slices shown]
[im 1/36]
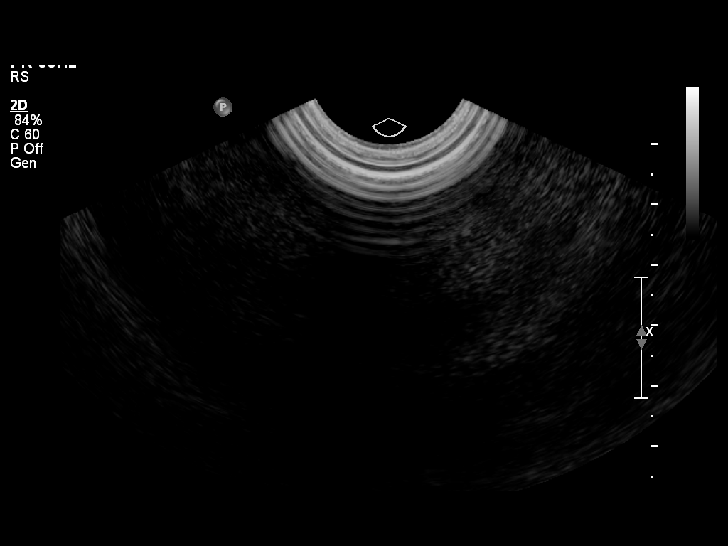
[im 3/36]
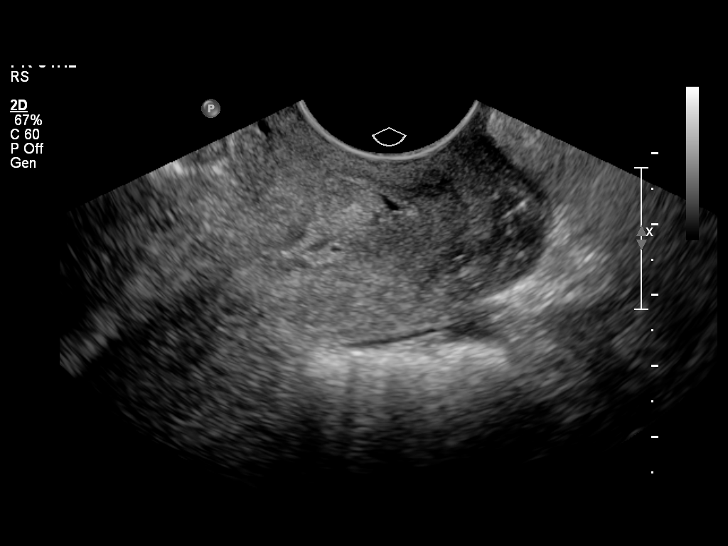
[im 5/36]
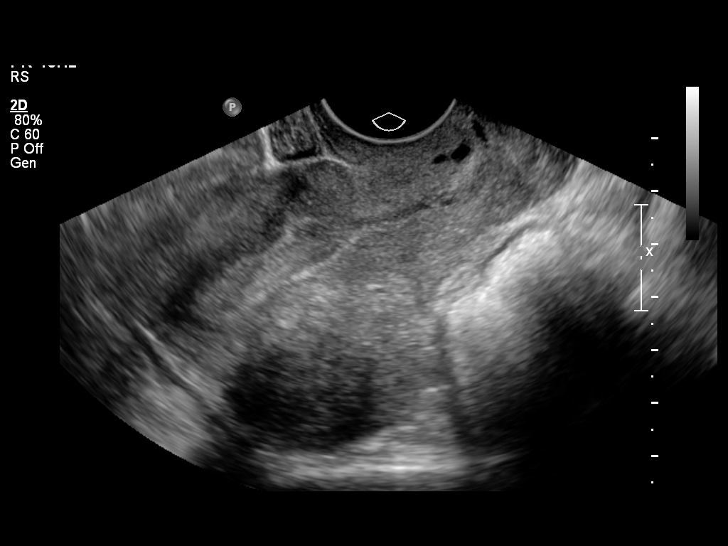
[im 10/36]
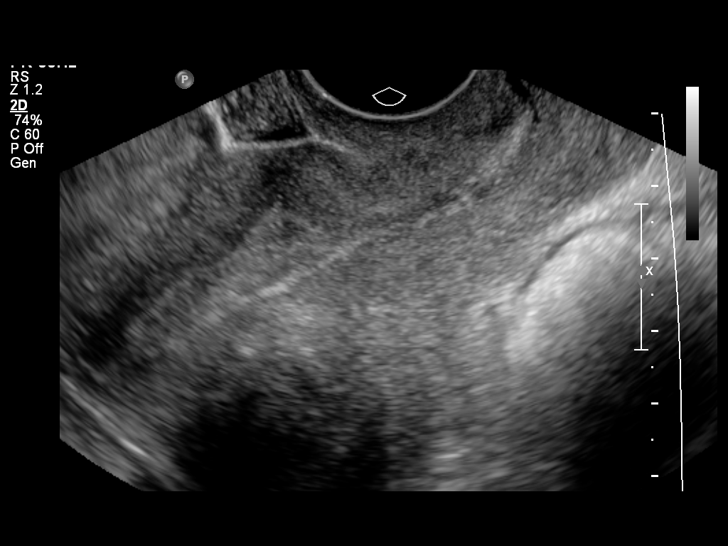
[im 12/36]
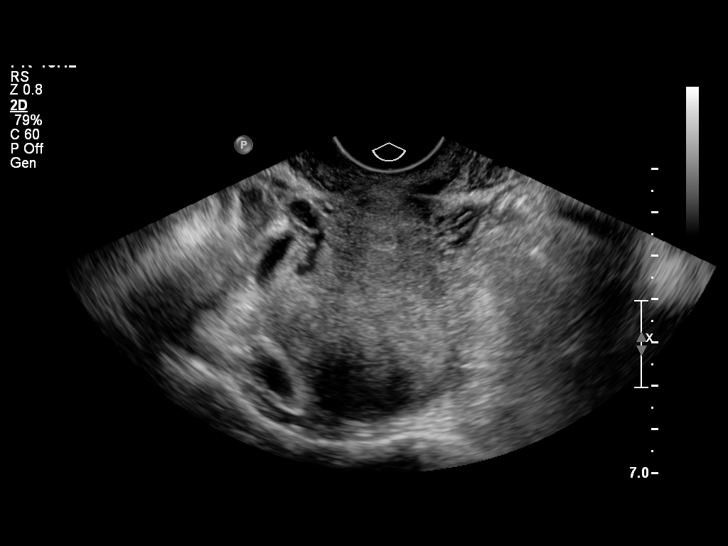
[im 15/36]
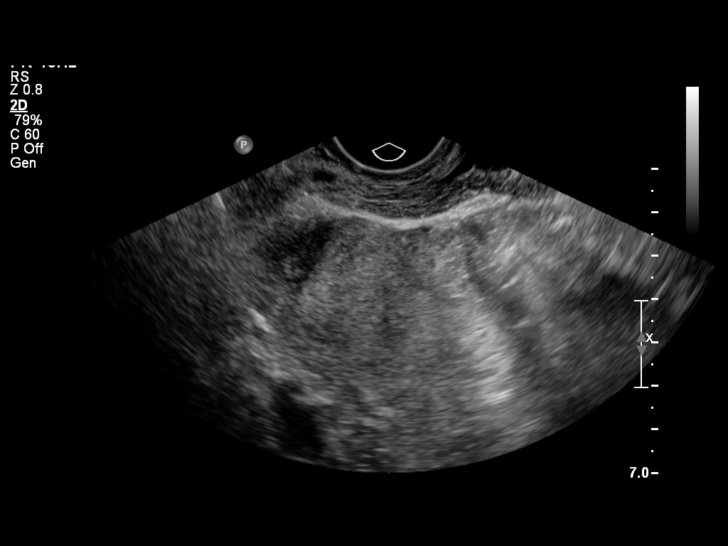
[im 17/36]
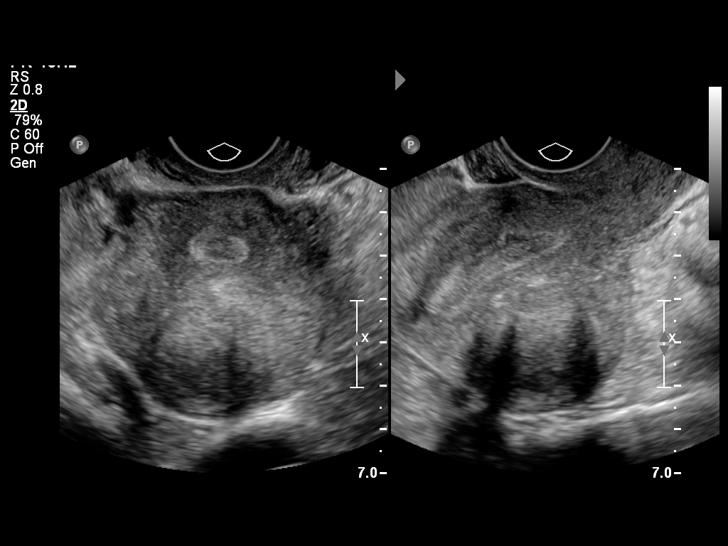
[im 19/36]
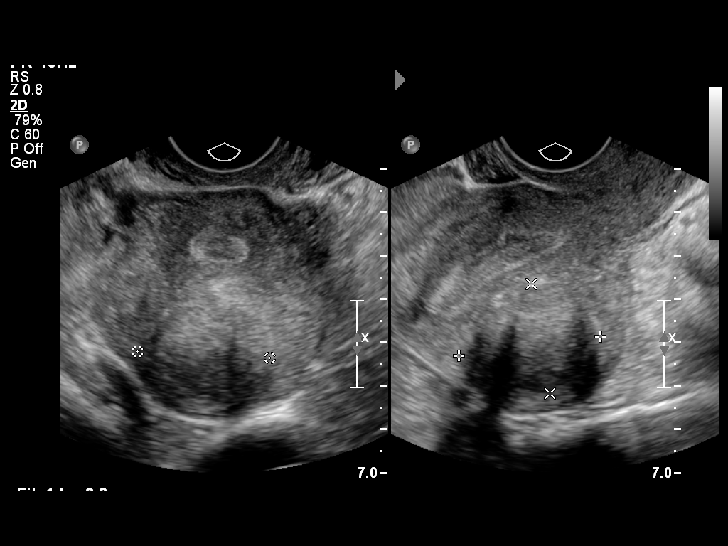
[im 22/36]
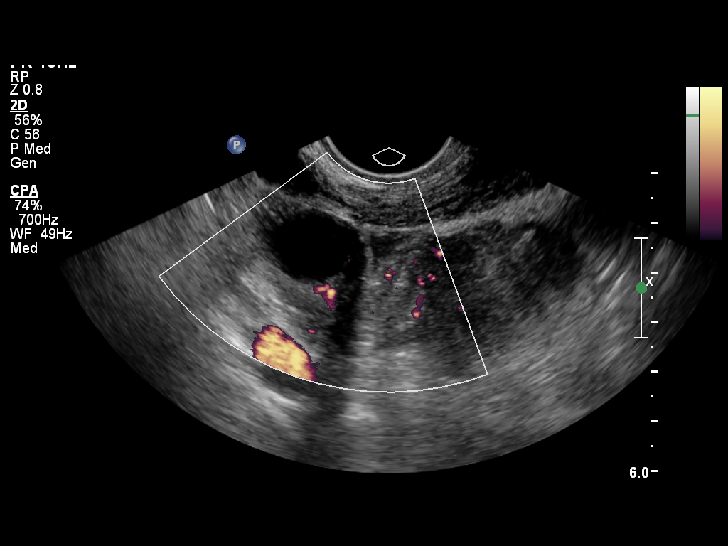
[im 24/36]
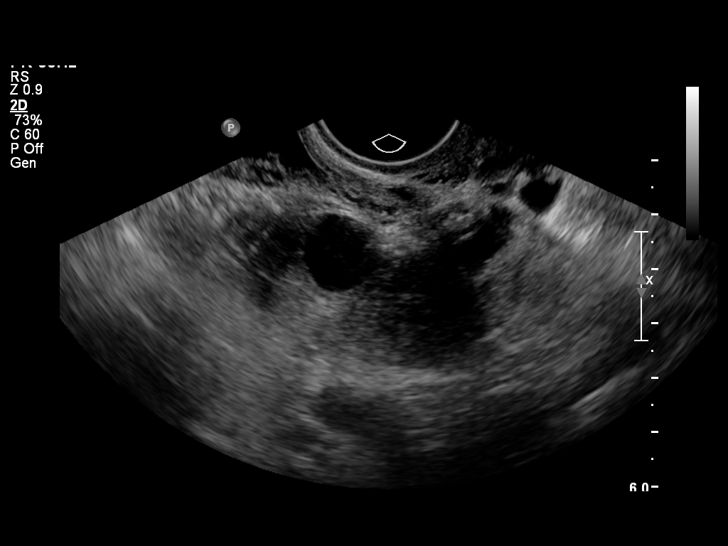
[im 29/36]
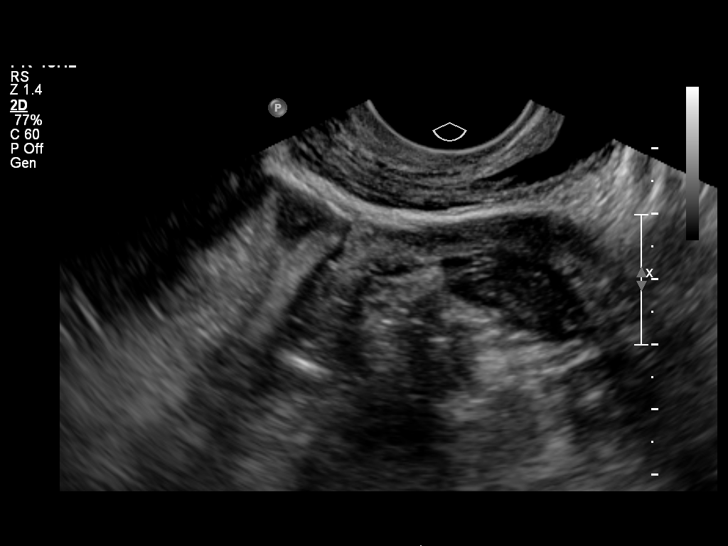
[im 31/36]
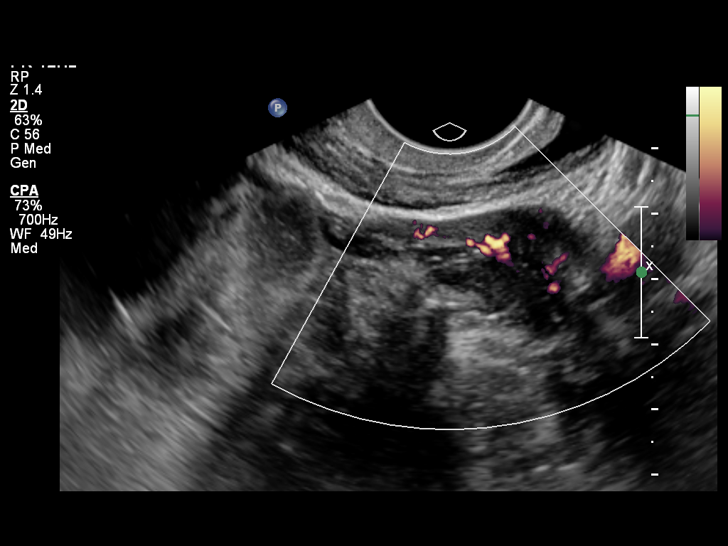
[im 33/36]
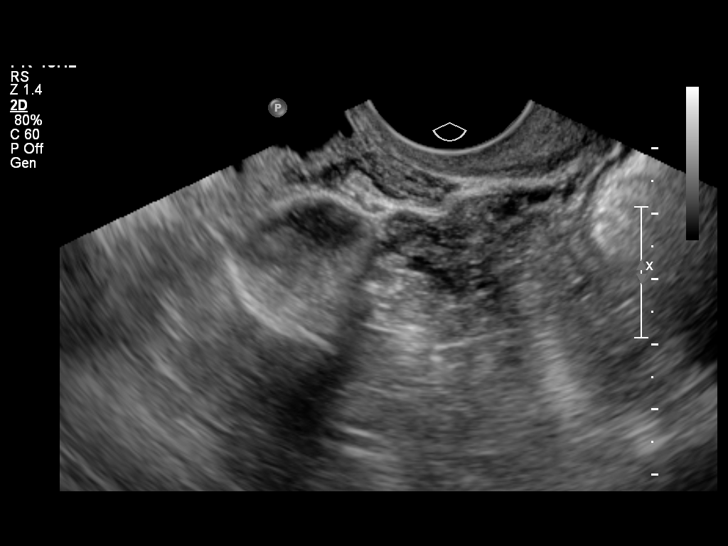
[im 36/36]
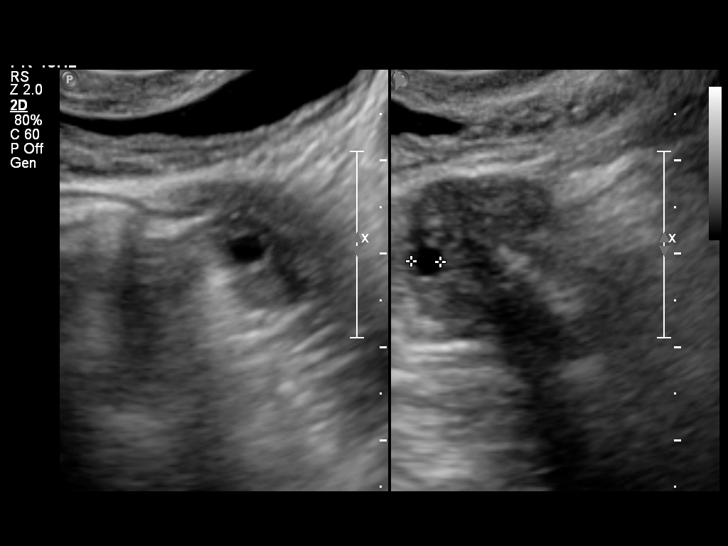

[14 of 16 positions shown; findings below may reference images not displayed]

FINDINGS: Uterus

A posterior uterine fibroid is seen measuring approximately 4 cm in
maximum diameter.

Endometrium

Thickness: 8 mm.  Trilayered.

Right ovary

Follicles measuring 10mm or greater: 1 measuring 1.6 cm

Estimated number of follicles <10mm: None

Left ovary

Follicles measuring 10mm or greater: None

Estimated number of follicles <10mm: 1

Other findings:  No free fluid
IMPRESSION: Single 1.6 cm right ovarian follicle. Single tiny less than 10 mm
follicle in left ovary.

4 cm posterior uterine fibroid.

## 2016-02-16 IMAGING — US US FOLLICLE
1 series · 14 of 16 positions shown · non-contrast
Comparison: None.

CLINICAL DATA: Infertility.  On ovulation induction medication.

EXAM:
TRANSVAGINAL ULTRASOUND OF PELVIS (FOLLICLE STUDY)
TECHNIQUE: Transvaginal ultrasound examination of the pelvis was performed
including evaluation of the uterus, ovaries, adnexal regions, and
pelvic cul-de-sac.

[Series 1: us follicle · 14 of 30 slices shown]
[im 1/30]
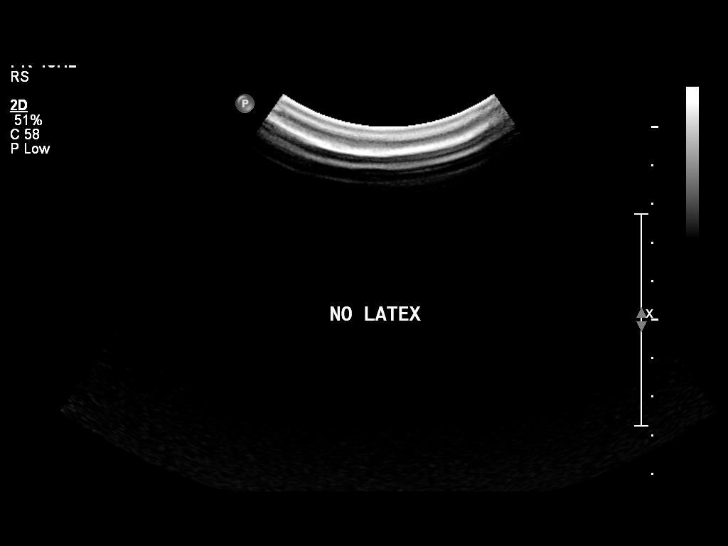
[im 2/30]
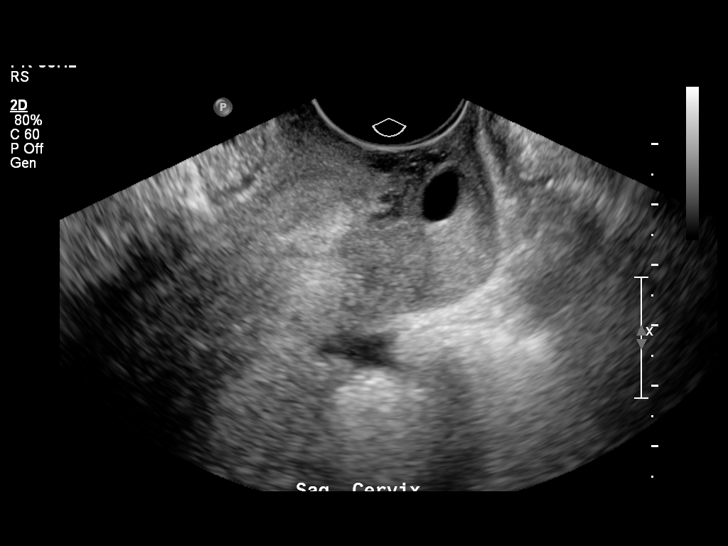
[im 4/30]
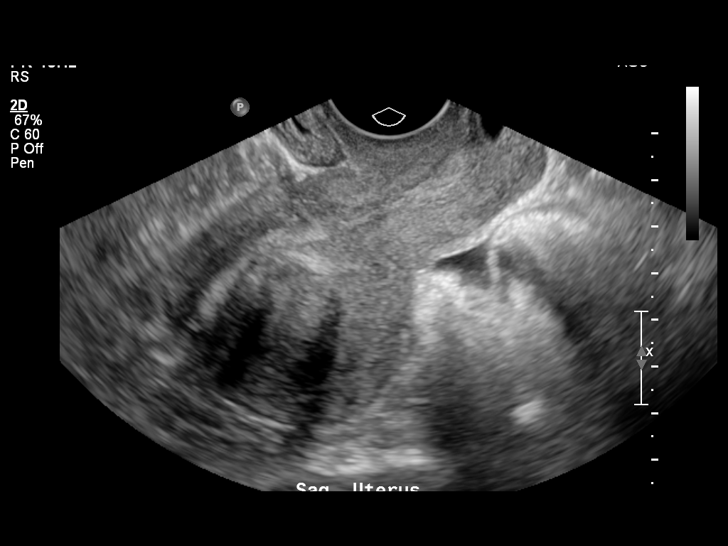
[im 8/30]
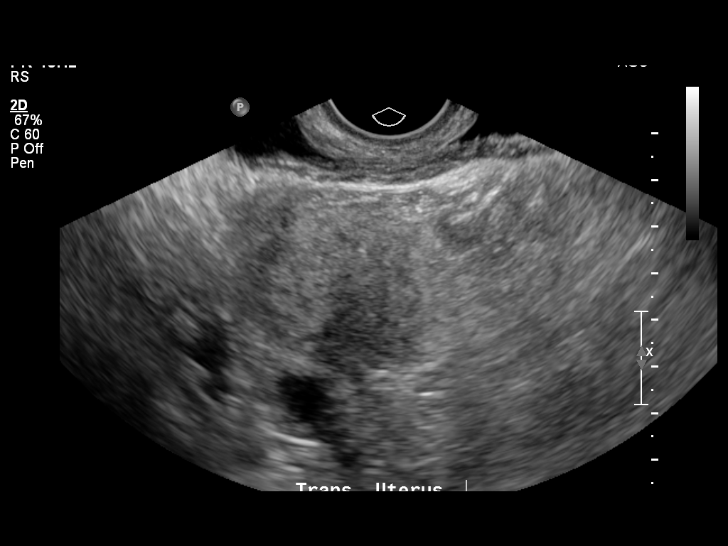
[im 10/30]
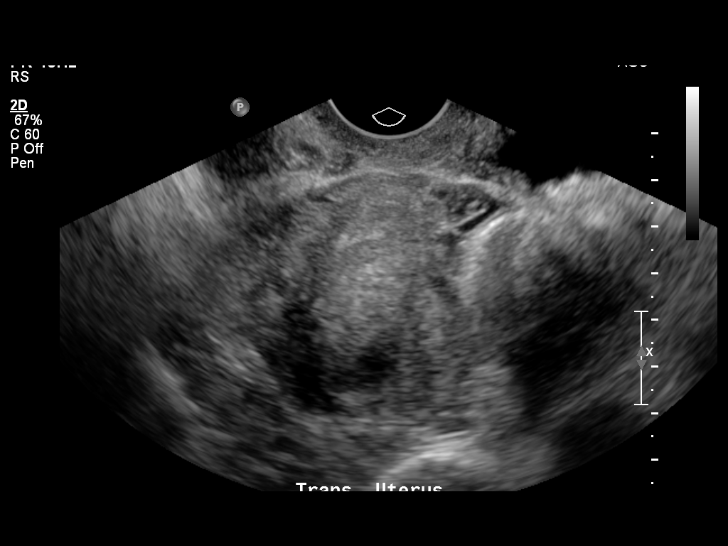
[im 12/30]
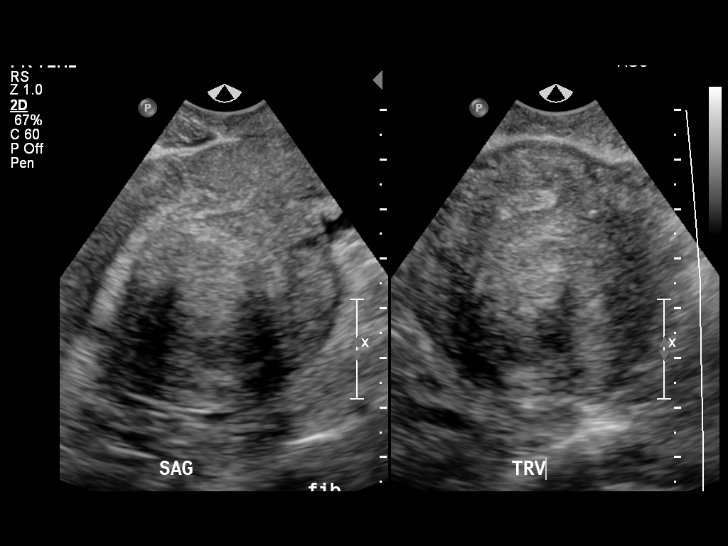
[im 14/30]
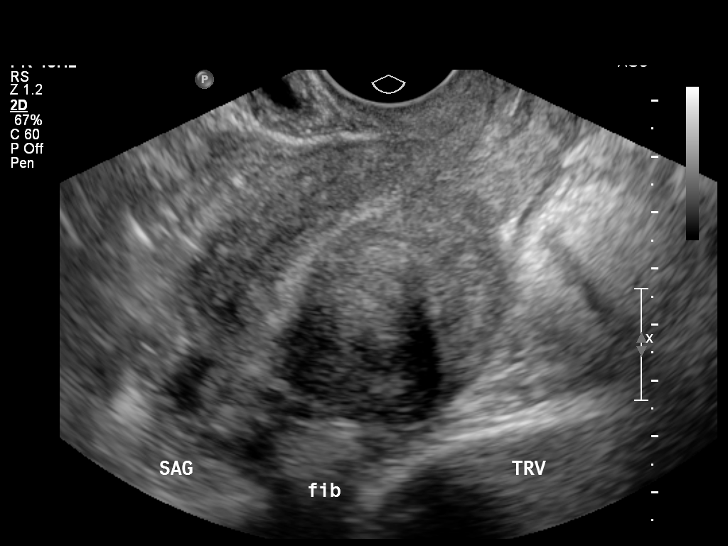
[im 16/30]
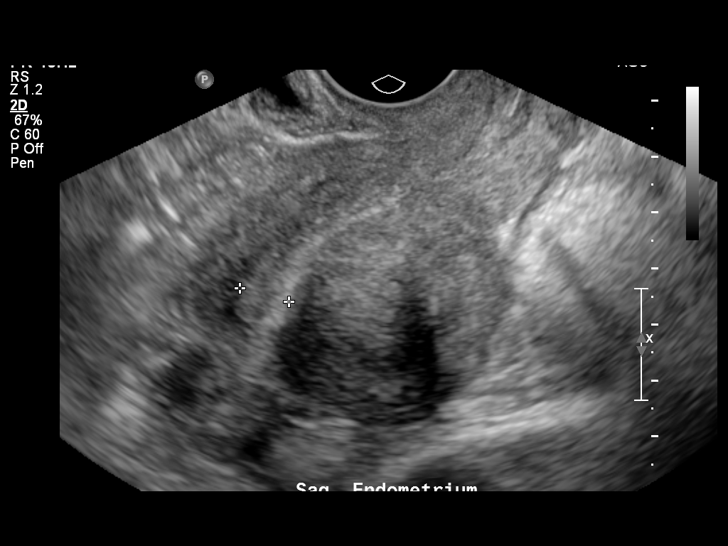
[im 18/30]
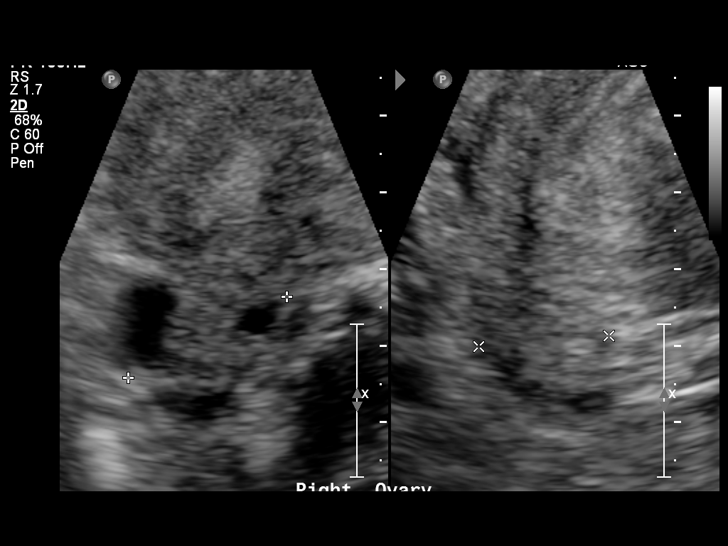
[im 20/30]
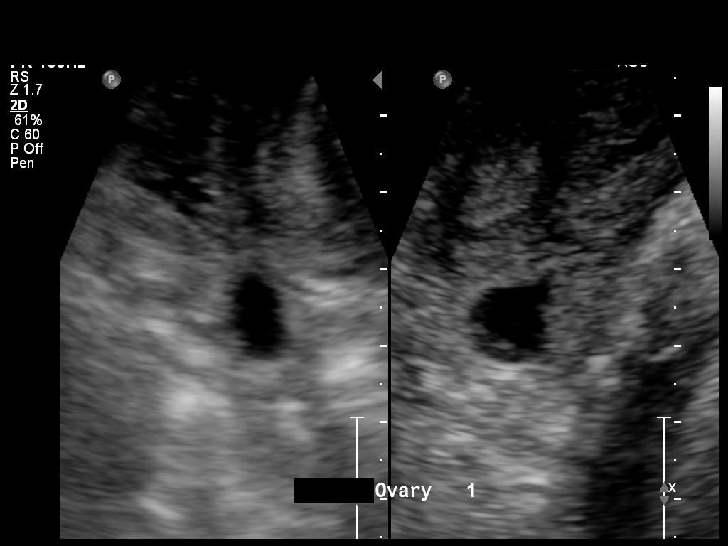
[im 24/30]
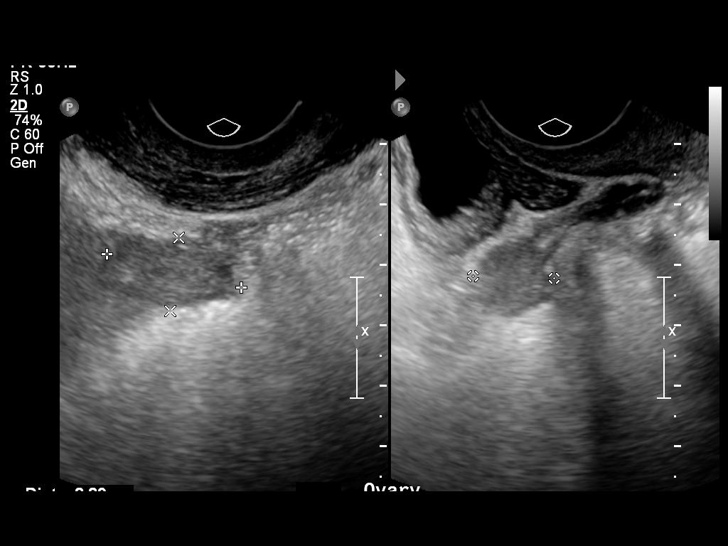
[im 26/30]
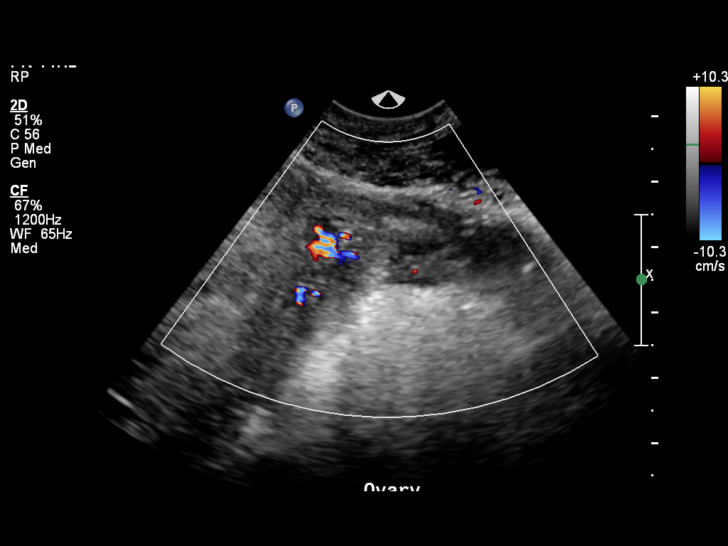
[im 28/30]
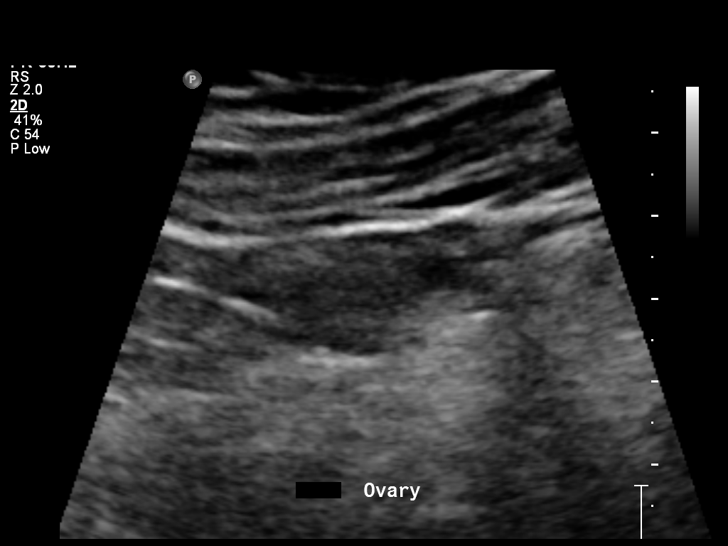
[im 30/30]
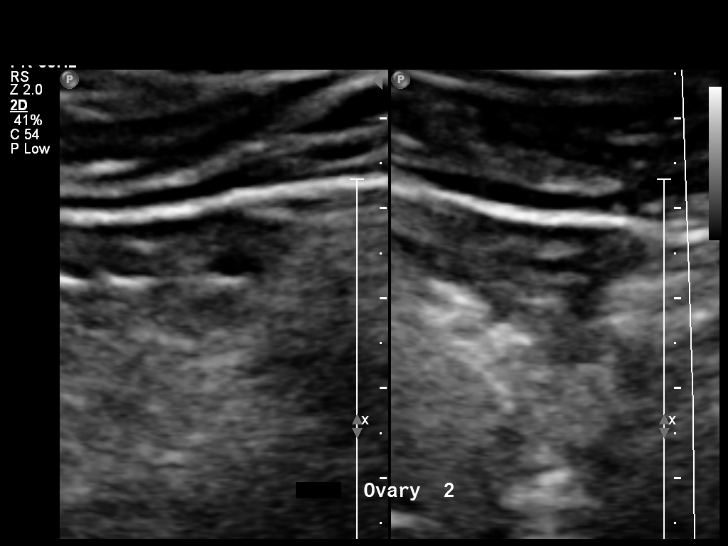

[14 of 16 positions shown; findings below may reference images not displayed]

FINDINGS: Uterus

An intramural fibroid is seen in the posterior corpus measuring
x 2.9 x 2.7 cm.

Endometrium

Thickness: 9 mm. Displaced anteriorly by submucosal component of
fibroid described above.

Right ovary

Follicles measuring 10mm or greater: 1 measuring 1.2 cm.

Estimated number of follicles <10mm: 1

Left ovary

Follicles measuring 10mm or greater: None

Estimated number of follicles <10mm: 2

Other findings:  No free fluid
IMPRESSION: Single 12 mm follicle in right ovary. No greater than 10 mm
follicles in the left ovary.

3.0 cm posterior uterine fibroid.
